# Patient Record
Sex: Female | Born: 1950 | Race: White | Hispanic: Refuse to answer | Smoking: Never smoker
Health system: Southern US, Community
[De-identification: ages and names within clinical notes are randomized; demographics above are authoritative.]

## PROBLEM LIST (undated history)

## (undated) DIAGNOSIS — I10 Essential (primary) hypertension: Secondary | ICD-10-CM

## (undated) DIAGNOSIS — E785 Hyperlipidemia, unspecified: Secondary | ICD-10-CM

## (undated) DIAGNOSIS — E7800 Pure hypercholesterolemia, unspecified: Secondary | ICD-10-CM

## (undated) HISTORY — DX: Pure hypercholesterolemia, unspecified: E78.00

## (undated) HISTORY — PX: OTHER SURGICAL HISTORY: SHX169

## (undated) HISTORY — DX: Hyperlipidemia, unspecified: E78.5

## (undated) HISTORY — PX: ANKLE RECONSTRUCTION: SHX1151

## (undated) HISTORY — PX: CARPAL TUNNEL RELEASE: SHX101

## (undated) HISTORY — PX: CHOLECYSTECTOMY: SHX55

---

## 1998-02-28 ENCOUNTER — Ambulatory Visit (HOSPITAL_COMMUNITY): Admission: RE | Admit: 1998-02-28 | Discharge: 1998-02-28 | Payer: Self-pay | Admitting: Cardiology

## 1998-02-28 ENCOUNTER — Encounter: Payer: Self-pay | Admitting: Cardiology

## 1998-03-01 ENCOUNTER — Encounter: Payer: Self-pay | Admitting: Surgery

## 1998-03-02 ENCOUNTER — Inpatient Hospital Stay (HOSPITAL_COMMUNITY): Admission: RE | Admit: 1998-03-02 | Discharge: 1998-03-03 | Payer: Self-pay | Admitting: Surgery

## 1999-03-04 ENCOUNTER — Other Ambulatory Visit: Admission: RE | Admit: 1999-03-04 | Discharge: 1999-03-04 | Payer: Self-pay | Admitting: Obstetrics and Gynecology

## 1999-08-26 ENCOUNTER — Other Ambulatory Visit: Admission: RE | Admit: 1999-08-26 | Discharge: 1999-08-26 | Payer: Self-pay | Admitting: Obstetrics and Gynecology

## 2000-03-27 ENCOUNTER — Other Ambulatory Visit: Admission: RE | Admit: 2000-03-27 | Discharge: 2000-03-27 | Payer: Self-pay | Admitting: Obstetrics and Gynecology

## 2003-12-20 ENCOUNTER — Other Ambulatory Visit: Admission: RE | Admit: 2003-12-20 | Discharge: 2003-12-20 | Payer: Self-pay | Admitting: Obstetrics and Gynecology

## 2007-02-05 ENCOUNTER — Encounter: Payer: Self-pay | Admitting: Cardiovascular Disease

## 2007-10-04 ENCOUNTER — Encounter: Admission: RE | Admit: 2007-10-04 | Discharge: 2007-10-04 | Payer: Self-pay | Admitting: Internal Medicine

## 2007-10-13 ENCOUNTER — Encounter: Admission: RE | Admit: 2007-10-13 | Discharge: 2007-10-13 | Payer: Self-pay | Admitting: Internal Medicine

## 2008-04-03 ENCOUNTER — Encounter: Admission: RE | Admit: 2008-04-03 | Discharge: 2008-04-03 | Payer: Self-pay | Admitting: Internal Medicine

## 2009-01-05 ENCOUNTER — Encounter: Admission: RE | Admit: 2009-01-05 | Discharge: 2009-01-05 | Payer: Self-pay | Admitting: Internal Medicine

## 2009-01-10 ENCOUNTER — Encounter: Admission: RE | Admit: 2009-01-10 | Discharge: 2009-01-10 | Payer: Self-pay | Admitting: Internal Medicine

## 2010-03-27 ENCOUNTER — Encounter: Admission: RE | Admit: 2010-03-27 | Discharge: 2010-03-27 | Payer: Self-pay | Admitting: Internal Medicine

## 2011-06-10 ENCOUNTER — Other Ambulatory Visit: Payer: Self-pay | Admitting: Internal Medicine

## 2011-06-10 DIAGNOSIS — Z1231 Encounter for screening mammogram for malignant neoplasm of breast: Secondary | ICD-10-CM

## 2011-06-27 ENCOUNTER — Ambulatory Visit
Admission: RE | Admit: 2011-06-27 | Discharge: 2011-06-27 | Disposition: A | Discharge status: Home / Self Care | Payer: BC Managed Care – PPO | Source: Ambulatory Visit | Attending: Internal Medicine | Admitting: Internal Medicine

## 2011-06-27 DIAGNOSIS — Z1231 Encounter for screening mammogram for malignant neoplasm of breast: Secondary | ICD-10-CM

## 2012-01-27 ENCOUNTER — Other Ambulatory Visit: Payer: Self-pay | Admitting: Internal Medicine

## 2012-01-27 DIAGNOSIS — R1032 Left lower quadrant pain: Secondary | ICD-10-CM

## 2012-04-29 ENCOUNTER — Ambulatory Visit
Admission: RE | Admit: 2012-04-29 | Discharge: 2012-04-29 | Disposition: A | Discharge status: Home / Self Care | Payer: BC Managed Care – PPO | Source: Ambulatory Visit | Attending: Internal Medicine | Admitting: Internal Medicine

## 2012-04-29 DIAGNOSIS — R1032 Left lower quadrant pain: Secondary | ICD-10-CM

## 2012-08-24 ENCOUNTER — Other Ambulatory Visit: Payer: Self-pay

## 2012-08-24 DIAGNOSIS — Z1231 Encounter for screening mammogram for malignant neoplasm of breast: Secondary | ICD-10-CM

## 2012-09-29 ENCOUNTER — Ambulatory Visit: Payer: BC Managed Care – PPO

## 2012-10-08 ENCOUNTER — Ambulatory Visit
Admission: RE | Admit: 2012-10-08 | Discharge: 2012-10-08 | Disposition: A | Discharge status: Home / Self Care | Payer: BC Managed Care – PPO | Source: Ambulatory Visit

## 2012-10-08 DIAGNOSIS — Z1231 Encounter for screening mammogram for malignant neoplasm of breast: Secondary | ICD-10-CM

## 2013-10-21 ENCOUNTER — Other Ambulatory Visit: Payer: Self-pay

## 2013-10-21 DIAGNOSIS — Z1231 Encounter for screening mammogram for malignant neoplasm of breast: Secondary | ICD-10-CM

## 2013-11-04 ENCOUNTER — Ambulatory Visit
Admission: RE | Admit: 2013-11-04 | Discharge: 2013-11-04 | Disposition: A | Discharge status: Home / Self Care | Payer: BC Managed Care – PPO | Source: Ambulatory Visit

## 2013-11-04 DIAGNOSIS — Z1231 Encounter for screening mammogram for malignant neoplasm of breast: Secondary | ICD-10-CM

## 2014-10-10 ENCOUNTER — Other Ambulatory Visit: Payer: Self-pay

## 2014-10-10 DIAGNOSIS — Z1231 Encounter for screening mammogram for malignant neoplasm of breast: Secondary | ICD-10-CM

## 2014-11-06 ENCOUNTER — Ambulatory Visit
Admission: RE | Admit: 2014-11-06 | Discharge: 2014-11-06 | Disposition: A | Discharge status: Home / Self Care | Payer: BC Managed Care – PPO | Source: Ambulatory Visit

## 2014-11-06 DIAGNOSIS — Z1231 Encounter for screening mammogram for malignant neoplasm of breast: Secondary | ICD-10-CM

## 2015-12-17 ENCOUNTER — Other Ambulatory Visit: Payer: Self-pay | Admitting: Internal Medicine

## 2015-12-17 DIAGNOSIS — Z1231 Encounter for screening mammogram for malignant neoplasm of breast: Secondary | ICD-10-CM

## 2015-12-19 ENCOUNTER — Ambulatory Visit
Admission: RE | Admit: 2015-12-19 | Discharge: 2015-12-19 | Disposition: A | Discharge status: Home / Self Care | Payer: BC Managed Care – PPO | Source: Ambulatory Visit | Attending: Internal Medicine | Admitting: Internal Medicine

## 2015-12-19 DIAGNOSIS — Z1231 Encounter for screening mammogram for malignant neoplasm of breast: Secondary | ICD-10-CM

## 2016-12-11 ENCOUNTER — Other Ambulatory Visit: Payer: Self-pay | Admitting: Internal Medicine

## 2016-12-11 DIAGNOSIS — Z1231 Encounter for screening mammogram for malignant neoplasm of breast: Secondary | ICD-10-CM

## 2016-12-30 ENCOUNTER — Ambulatory Visit
Admission: RE | Admit: 2016-12-30 | Discharge: 2016-12-30 | Disposition: A | Discharge status: Home / Self Care | Payer: BC Managed Care – PPO | Source: Ambulatory Visit | Attending: Internal Medicine | Admitting: Internal Medicine

## 2016-12-30 DIAGNOSIS — Z1231 Encounter for screening mammogram for malignant neoplasm of breast: Secondary | ICD-10-CM

## 2017-12-31 ENCOUNTER — Other Ambulatory Visit: Payer: Self-pay | Admitting: Internal Medicine

## 2017-12-31 DIAGNOSIS — M5416 Radiculopathy, lumbar region: Secondary | ICD-10-CM

## 2018-01-01 ENCOUNTER — Ambulatory Visit
Admission: RE | Admit: 2018-01-01 | Discharge: 2018-01-01 | Disposition: A | Discharge status: Home / Self Care | Payer: Medicare Other | Source: Ambulatory Visit | Attending: Internal Medicine | Admitting: Internal Medicine

## 2018-01-01 DIAGNOSIS — M5416 Radiculopathy, lumbar region: Secondary | ICD-10-CM

## 2018-02-05 ENCOUNTER — Other Ambulatory Visit: Payer: Self-pay | Admitting: Internal Medicine

## 2018-02-05 DIAGNOSIS — Z1231 Encounter for screening mammogram for malignant neoplasm of breast: Secondary | ICD-10-CM

## 2018-03-09 ENCOUNTER — Ambulatory Visit
Admission: RE | Admit: 2018-03-09 | Discharge: 2018-03-09 | Disposition: A | Discharge status: Home / Self Care | Payer: Medicare Other | Source: Ambulatory Visit | Attending: Internal Medicine | Admitting: Internal Medicine

## 2018-03-09 DIAGNOSIS — Z1231 Encounter for screening mammogram for malignant neoplasm of breast: Secondary | ICD-10-CM

## 2019-02-07 ENCOUNTER — Other Ambulatory Visit: Payer: Self-pay | Admitting: Internal Medicine

## 2019-02-07 DIAGNOSIS — Z1231 Encounter for screening mammogram for malignant neoplasm of breast: Secondary | ICD-10-CM

## 2019-03-23 ENCOUNTER — Ambulatory Visit
Admission: RE | Admit: 2019-03-23 | Discharge: 2019-03-23 | Disposition: A | Discharge status: Home / Self Care | Payer: Medicare Other | Source: Ambulatory Visit | Attending: Internal Medicine | Admitting: Internal Medicine

## 2019-03-23 ENCOUNTER — Other Ambulatory Visit: Payer: Self-pay

## 2019-03-23 DIAGNOSIS — Z1231 Encounter for screening mammogram for malignant neoplasm of breast: Secondary | ICD-10-CM

## 2019-09-05 ENCOUNTER — Encounter (HOSPITAL_COMMUNITY): Payer: Self-pay | Admitting: *Deleted

## 2019-09-05 ENCOUNTER — Emergency Department (HOSPITAL_COMMUNITY)
Admission: EM | Admit: 2019-09-05 | Discharge: 2019-09-06 | Disposition: A | Discharge status: Home / Self Care | Payer: Medicare Other | Attending: Emergency Medicine | Admitting: Emergency Medicine

## 2019-09-05 ENCOUNTER — Other Ambulatory Visit: Payer: Self-pay

## 2019-09-05 DIAGNOSIS — R110 Nausea: Secondary | ICD-10-CM | POA: Insufficient documentation

## 2019-09-05 DIAGNOSIS — Z7984 Long term (current) use of oral hypoglycemic drugs: Secondary | ICD-10-CM | POA: Insufficient documentation

## 2019-09-05 DIAGNOSIS — Z7982 Long term (current) use of aspirin: Secondary | ICD-10-CM | POA: Insufficient documentation

## 2019-09-05 DIAGNOSIS — I447 Left bundle-branch block, unspecified: Secondary | ICD-10-CM | POA: Diagnosis not present

## 2019-09-05 DIAGNOSIS — R42 Dizziness and giddiness: Secondary | ICD-10-CM

## 2019-09-05 DIAGNOSIS — E119 Type 2 diabetes mellitus without complications: Secondary | ICD-10-CM | POA: Insufficient documentation

## 2019-09-05 DIAGNOSIS — Z79899 Other long term (current) drug therapy: Secondary | ICD-10-CM | POA: Diagnosis not present

## 2019-09-05 LAB — CBC WITH DIFFERENTIAL/PLATELET
Abs Immature Granulocytes: 0.03 10*3/uL (ref 0.00–0.07)
Basophils Absolute: 0.1 10*3/uL (ref 0.0–0.1)
Basophils Relative: 1 %
Eosinophils Absolute: 0.3 10*3/uL (ref 0.0–0.5)
Eosinophils Relative: 3 %
HCT: 43.3 % (ref 36.0–46.0)
Hemoglobin: 14.1 g/dL (ref 12.0–15.0)
Immature Granulocytes: 0 %
Lymphocytes Relative: 34 %
Lymphs Abs: 3.5 10*3/uL (ref 0.7–4.0)
MCH: 29 pg (ref 26.0–34.0)
MCHC: 32.6 g/dL (ref 30.0–36.0)
MCV: 89.1 fL (ref 80.0–100.0)
Monocytes Absolute: 0.6 10*3/uL (ref 0.1–1.0)
Monocytes Relative: 6 %
Neutro Abs: 5.8 10*3/uL (ref 1.7–7.7)
Neutrophils Relative %: 56 %
Platelets: 307 10*3/uL (ref 150–400)
RBC: 4.86 MIL/uL (ref 3.87–5.11)
RDW: 13.3 % (ref 11.5–15.5)
WBC: 10.2 10*3/uL (ref 4.0–10.5)
nRBC: 0 % (ref 0.0–0.2)

## 2019-09-05 LAB — BASIC METABOLIC PANEL
Anion gap: 7 (ref 5–15)
BUN: 12 mg/dL (ref 8–23)
CO2: 27 mmol/L (ref 22–32)
Calcium: 9.6 mg/dL (ref 8.9–10.3)
Chloride: 107 mmol/L (ref 98–111)
Creatinine, Ser: 0.78 mg/dL (ref 0.44–1.00)
GFR calc Af Amer: >60 mL/min (ref >60)
GFR calc non Af Amer: >60 mL/min (ref >60)
Glucose, Bld: 153 mg/dL — ABNORMAL HIGH (ref 70–99)
Potassium: 3.8 mmol/L (ref 3.5–5.1)
Sodium: 141 mmol/L (ref 135–145)

## 2019-09-05 NOTE — ED Triage Notes (Signed)
Last 3 weeks went to PCP for difficulty swallowing , last week went to see ent and had procedure done was put on medication for GERD however it gave her diarrhea ,so she has to stop the medicine, this am woke felt unsteady on her feet . States she flet nauseated.

## 2019-09-05 NOTE — ED Notes (Signed)
Have pt call soncornesha radziewicz at [phone]

## 2019-09-06 ENCOUNTER — Emergency Department (HOSPITAL_COMMUNITY): Payer: Medicare Other

## 2019-09-06 ENCOUNTER — Encounter (HOSPITAL_COMMUNITY): Payer: Self-pay | Admitting: Emergency Medicine

## 2019-09-06 LAB — URINALYSIS, ROUTINE W REFLEX MICROSCOPIC
Bilirubin Urine: NEGATIVE
Glucose, UA: NEGATIVE mg/dL
Hgb urine dipstick: NEGATIVE
Ketones, ur: 5 mg/dL — AB
Nitrite: NEGATIVE
Protein, ur: 30 mg/dL — AB
Specific Gravity, Urine: 1.027 (ref 1.005–1.030)
WBC, UA: >50 WBC/hpf — ABNORMAL HIGH (ref 0–5)
pH: 5 (ref 5.0–8.0)

## 2019-09-06 LAB — TROPONIN I (HIGH SENSITIVITY)
Troponin I (High Sensitivity): 6 ng/L (ref <18)
Troponin I (High Sensitivity): 8 ng/L (ref <18)

## 2019-09-06 MED ORDER — MECLIZINE HCL 50 MG PO TABS: 50.0000 mg | ORAL_TABLET | Freq: Three times a day (TID) | ORAL | 0 refills | Status: DC | PRN

## 2019-09-06 MED ORDER — FOSFOMYCIN TROMETHAMINE 3 G PO PACK
3.0000 g | PACK | ORAL | Status: AC
  Administered 2019-09-06: 3 g via ORAL
  Filled 2019-09-06: qty 3

## 2019-09-06 MED ORDER — ONDANSETRON 8 MG PO TBDP: ORAL_TABLET | ORAL | 0 refills | Status: DC

## 2019-09-06 MED ORDER — GADOBUTROL 1 MMOL/ML IV SOLN
10.0000 mL | Freq: Once | INTRAVENOUS | Status: AC | PRN
  Administered 2019-09-06: 10 mL via INTRAVENOUS

## 2019-09-06 MED ORDER — ONDANSETRON 4 MG PO TBDP
8.0000 mg | ORAL_TABLET | Freq: Once | ORAL | Status: AC
  Administered 2019-09-06: 05:00:00 8 mg via ORAL
  Filled 2019-09-06: qty 2

## 2019-09-06 MED ORDER — SODIUM CHLORIDE 0.9 % IV BOLUS
500.0000 mL | INTRAVENOUS | Status: AC
  Administered 2019-09-06: 500 mL via INTRAVENOUS

## 2019-09-06 NOTE — ED Provider Notes (Signed)
69 yo F with a chief complaints of dizziness.  Not described as a vertiginous-like syndrome.  Seen by Dr. Nicanor Alcon and I received the patient in signout.  Plan for MRI and delta troponin and UA.  MRI is returned and is negative for acute stroke.  Delta Theodis Aguas is negative.  UA with large leukocyte esterase and greater than 50 white blood cells.  Rare bacteria.  Patient has no urinary symptoms on reassessment.  Will give 1 dose of fosfomycin to cover for possible infection.  We will have her follow-up with her family doctor and cardiologist that she also had a new bundle branch block of unknown timing.  The case was discussed with cardiology who recommended delta troponin and outpatient follow-up.     Melene Plan, DO 09/06/19 1101

## 2019-09-06 NOTE — Discharge Instructions (Addendum)
Please follow-up with your family doctor and the cardiologist in the office.  Your urine showed white blood cells but did not show bacteria, this is less likely to be urinary tract infection but we will give you a one-time dose of an antibiotic which should cover it if you have one.  Please return to the emergency department for chest pain worsening dizziness inability to walk one-sided numbness or weakness.

## 2019-09-06 NOTE — ED Provider Notes (Signed)
[facility] EMERGENCY DEPARTMENT Provider Note   CSN: [id number] Arrival date & time: 09/05/19  1435     History No chief complaint on file.   Kristy Medina is a 69 y.o. female.  The history is provided by the patient.  Dizziness Quality:  Lightheadedness Severity:  Moderate Onset quality:  Gradual Duration:  1 day Timing:  Constant Progression:  Improving Chronicity:  New Context: not when bending over, not with inactivity, not with loss of consciousness and not when standing up   Relieved by:  Nothing Worsened by:  Nothing Ineffective treatments:  None tried Associated symptoms: nausea   Associated symptoms: no chest pain, no shortness of breath and no weakness   Risk factors: no anemia and no hx of vertigo   Patient with gerd reports nausea and lightheadedhess. No weakness no numbness.  No changes in vision or speech.  Has been having off and on RUE pain in the area of the deltoid that she attributed to work. No CP.  No SOB.  No exertional symptoms.  No vomiting no diaphoresis.        Past Medical History:  Diagnosis Date  . Chest pain   . Diabetes mellitus   . Hypercholesterolemia   . Hyperlipidemia     There are no problems to display for this patient.   Past Surgical History:  Procedure Laterality Date  . ANKLE RECONSTRUCTION    . CARPAL TUNNEL RELEASE    . CHOLECYSTECTOMY    . mastoid surgery       OB History   No obstetric history on file.     Family History  Problem Relation Age of Onset  . COPD Sister   . Lung cancer Brother   . Kidney cancer Brother   . Heart attack Brother   . Breast cancer Maternal Grandmother     Social History   Tobacco Use  . Smoking status: Never Smoker  . Smokeless tobacco: Never Used  Substance Use Topics  . Alcohol use: No  . Drug use: Not on file    Home Medications Prior to Admission medications   Medication Sig Start Date End Date Taking? Authorizing Provider  aspirin 81  MG tablet Take 81 mg by mouth every other day.    [provider]  b complex vitamins tablet Take 1 tablet by mouth daily.    [provider]  benazepril (LOTENSIN) 5 MG tablet Take 5 mg by mouth daily.    [provider]  Cholecalciferol (VITAMIN D) 2000 UNITS tablet Take 2,000 Units by mouth daily.    [provider]  fish oil-omega-3 fatty acids 1000 MG capsule Take 2 g by mouth daily. Take 6 tabs daily    [provider]  Garlic 1000 MG CAPS Take by mouth daily.    [provider]  metFORMIN (GLUCOPHAGE) 500 MG tablet Take 500 mg by mouth 2 (two) times daily with a meal.    [provider]  saxagliptin HCl (ONGLYZA) 5 MG TABS tablet Take by mouth daily.    [provider]  simvastatin (ZOCOR) 40 MG tablet Take 40 mg by mouth every evening.    [provider]    Allergies    Erythromycin  Review of Systems   Review of Systems  Constitutional: Negative for fever.  HENT: Negative for congestion.   Eyes: Negative for visual disturbance.  Respiratory: Negative for cough and shortness of breath.   Cardiovascular: Negative for chest pain.  Gastrointestinal: Positive for nausea. Negative for abdominal pain.  Genitourinary: Negative for difficulty urinating.  Musculoskeletal: Positive for arthralgias. Negative for gait problem.  Skin: Negative for rash.  Neurological: Positive for dizziness and light-headedness. Negative for weakness and numbness.  Psychiatric/Behavioral: Negative for agitation.    Physical Exam Updated Vital Signs BP 140/60 (BP Location: Left Arm)   Pulse 83   Temp 98 F (36.7 C) (Oral)   Resp 17   Ht 5\' 5"  (1.651 m)   Wt 98 kg   SpO2 95%   BMI 35.94 kg/m   Physical Exam Vitals and nursing note reviewed.  Constitutional:      General: She is not in acute distress.    Appearance: Normal appearance.  HENT:     Head: Normocephalic and atraumatic.     Nose: Nose normal.      Mouth/Throat:     Mouth: Mucous membranes are moist.  Eyes:     Extraocular Movements: Extraocular movements intact.     Conjunctiva/sclera: Conjunctivae normal.     Pupils: Pupils are equal, round, and reactive to light.  Cardiovascular:     Rate and Rhythm: Normal rate and regular rhythm.     Pulses: Normal pulses.     Heart sounds: Normal heart sounds.  Pulmonary:     Effort: Pulmonary effort is normal.     Breath sounds: Normal breath sounds.  Abdominal:     General: Abdomen is flat. Bowel sounds are normal.     Tenderness: There is no abdominal tenderness. There is no guarding or rebound.  Musculoskeletal:        General: Normal range of motion.     Cervical back: Normal range of motion and neck supple.  Skin:    General: Skin is warm and dry.     Capillary Refill: Capillary refill takes less than 2 seconds.  Neurological:     General: No focal deficit present.     Mental Status: She is alert and oriented to person, place, and time.     Cranial Nerves: No cranial nerve deficit.     Deep Tendon Reflexes: Reflexes normal.  Psychiatric:        Mood and Affect: Mood normal.        Behavior: Behavior normal.     ED Results / Procedures / Treatments   Labs (all labs ordered are listed, but only abnormal results are displayed) Results for orders placed or performed during the hospital encounter of 09/05/19  CBC with Differential  Result Value Ref Range   WBC 10.2 4.0 - 10.5 K/uL   RBC 4.86 3.87 - 5.11 MIL/uL   Hemoglobin 14.1 12.0 - 15.0 g/dL   HCT 43.3 36.0 - 46.0 %   MCV 89.1 80.0 - 100.0 fL   MCH 29.0 26.0 - 34.0 pg   MCHC 32.6 30.0 - 36.0 g/dL   RDW 13.3 11.5 - 15.5 %   Platelets 307 150 - 400 K/uL   nRBC 0.0 0.0 - 0.2 %   Neutrophils Relative % 56 %   Neutro Abs 5.8 1.7 - 7.7 K/uL   Lymphocytes Relative 34 %   Lymphs Abs 3.5 0.7 - 4.0 K/uL   Monocytes Relative 6 %   Monocytes Absolute 0.6 0.1 - 1.0 K/uL   Eosinophils Relative 3 %   Eosinophils Absolute 0.3  0.0 - 0.5 K/uL   Basophils Relative 1 %   Basophils Absolute 0.1 0.0 - 0.1 K/uL   Immature Granulocytes 0 %   Abs  Immature Granulocytes 0.03 0.00 - 0.07 K/uL  Basic metabolic panel  Result Value Ref Range   Sodium 141 135 - 145 mmol/L   Potassium 3.8 3.5 - 5.1 mmol/L   Chloride 107 98 - 111 mmol/L   CO2 27 22 - 32 mmol/L   Glucose, Bld 153 (H) 70 - 99 mg/dL   BUN 12 8 - 23 mg/dL   Creatinine, Ser 1.15 0.44 - 1.00 mg/dL   Calcium 9.6 8.9 - 72.6 mg/dL   GFR calc non Af Amer >60 >60 mL/min   GFR calc Af Amer >60 >60 mL/min   Anion gap 7 5 - 15   CT Head Wo Contrast  Result Date: 09/06/2019 CLINICAL DATA:  Lightheadedness EXAM: CT HEAD WITHOUT CONTRAST TECHNIQUE: Contiguous axial images were obtained from the base of the skull through the vertex without intravenous contrast. COMPARISON:  None. FINDINGS: Brain: No evidence of acute infarction, hemorrhage, hydrocephalus, extra-axial collection or mass lesion/mass effect. Vascular: No hyperdense vessel or unexpected calcification. Skull: Hyperostosis interna, incidental. Negative for fracture or focal lesion. Sinuses/Orbits: Negative. IMPRESSION: Negative head CT. Electronically Signed   By: Marnee Spring M.D.   On: 09/06/2019 05:30    EKG EKG Interpretation  Date/Time:  Monday Telena Peyser 12 2021 15:17:06 EDT Ventricular Rate:  80 PR Interval:  186 QRS Duration: 144 QT Interval:  422 QTC Calculation: 486 R Axis:   -57 Text Interpretation: Normal sinus rhythm Left axis deviation Left bundle branch block Confirmed by Manasvi Dickard ([id number]) on 09/06/2019 5:01:51 AM   Radiology No results found.  Procedures Procedures (including critical care time)  Medications Ordered in ED Medications  sodium chloride 0.9 % bolus 500 mL (has no administration in time range)  ondansetron (ZOFRAN-ODT) disintegrating tablet 8 mg (8 mg Oral Given 09/06/19 0509)    ED Course  I have reviewed the triage vital signs and the nursing notes.  Pertinent  labs & imaging results that were available during my care of the patient were reviewed by me and considered in my medical decision making (see chart for details).   559 Case d/w Dr. Otelia Limes MRI of the brain with and without, look at vestibular cochlear exam.     643 case d/w Dr. Loyal Gambler of cardiology.  Delta troponin, if negative may follow up as an outpatient for echocardiogram.    Will need an MRI to assess the light headedness.  Patient has a new left bundle branch block.  We do not have an old EKG and will need to follow up with cardiology.    Patient has been informed of left bundle branch block and need to follow up with cardiology as an outpatient.  Patient verbalizes understanding and agrees to follow up.    Final Clinical Impression(s) / ED Diagnoses   Signed out to Dr. Adela Lank pending MRI and delta troponin.     Estevan Kersh, MD 09/06/19 9741

## 2019-09-06 NOTE — ED Notes (Signed)
Pt verbalized understanding of discharge instructions. Follow up care and prescriptions reviewed w/ pt and daughter-in-law. Pt brought to lobby via wheelchair.

## 2019-09-07 LAB — URINE CULTURE

## 2019-09-12 ENCOUNTER — Other Ambulatory Visit: Payer: Self-pay

## 2019-09-12 ENCOUNTER — Encounter: Payer: Self-pay | Admitting: Interventional Cardiology

## 2019-09-12 ENCOUNTER — Ambulatory Visit: Payer: Medicare Other | Admitting: Interventional Cardiology

## 2019-09-12 VITALS — BP 122/78 | HR 88 | Ht 65.0 in | Wt 215.0 lb

## 2019-09-12 DIAGNOSIS — R0600 Dyspnea, unspecified: Secondary | ICD-10-CM | POA: Diagnosis not present

## 2019-09-12 DIAGNOSIS — E1165 Type 2 diabetes mellitus with hyperglycemia: Secondary | ICD-10-CM | POA: Diagnosis not present

## 2019-09-12 DIAGNOSIS — R0602 Shortness of breath: Secondary | ICD-10-CM | POA: Diagnosis not present

## 2019-09-12 DIAGNOSIS — R9431 Abnormal electrocardiogram [ECG] [EKG]: Secondary | ICD-10-CM

## 2019-09-12 DIAGNOSIS — Z794 Long term (current) use of insulin: Secondary | ICD-10-CM

## 2019-09-12 DIAGNOSIS — R0609 Other forms of dyspnea: Secondary | ICD-10-CM

## 2019-09-12 MED ORDER — METOPROLOL TARTRATE 100 MG PO TABS: ORAL_TABLET | ORAL | 0 refills | Status: DC

## 2019-09-12 NOTE — Progress Notes (Signed)
Cardiology Office Note   Date:  09/12/2019   ID:  Kristy Medina, DOB 1950-09-23, MRN [id number]  PCP:  Crist Infante, MD    No chief complaint on file.  LBBB  Wt Readings from Last 3 Encounters:  09/12/19 215 lb (97.5 kg)  09/05/19 216 lb (98 kg)       History of Present Illness: Kristy Medina is a 69 y.o. female who is being seen today for the evaluation of  at the request of Crist Infante, MD.  In 2008, she had an abnormal nuc, partially due to breast attenuation.  She had some dysphagia in 2021 and had an eval from ENT.  GERD meds have been adjusted.  Now on Pepcid.  Had severe dizziness on 4/12 and went to ER.  SHe was noted to have an LBBB.  Troponins were negative.  She describes it as a lightheadedness. Has felt well since that time.   Denies : Chest pain. Dizziness. Leg edema. Nitroglycerin use. Orthopnea. Palpitations. Paroxysmal nocturnal dyspnea. Shortness of breath. Syncope.   Father with MI in his 37s.   She has been diabetic for 23 years.  A1C 7.2.   Kristy Medina has been most strenuous exercise.  She has had some DOE; decreased stamina.  Her walking has decreased recently as well.        Past Medical History:  Diagnosis Date  . Chest pain   . Diabetes mellitus   . Hypercholesterolemia   . Hyperlipidemia     Past Surgical History:  Procedure Laterality Date  . ANKLE RECONSTRUCTION    . CARPAL TUNNEL RELEASE    . CHOLECYSTECTOMY    . mastoid surgery       Current Outpatient Medications  Medication Sig Dispense Refill  . acetaminophen (TYLENOL) 325 MG tablet Take 650 mg by mouth every 6 (six) hours as needed for mild pain or headache.    Marland Kitchen aspirin 81 MG tablet Take 81 mg by mouth every other day.    . b complex vitamins tablet Take 1 tablet by mouth daily.    Marland Kitchen BIOTIN PO Take 1 tablet by mouth daily.    . Cholecalciferol (VITAMIN D3) 50 MCG (2000 UT) TABS Take 2,000 Units by mouth daily.    . famotidine (PEPCID AC MAXIMUM  STRENGTH) 20 MG tablet Take 20 mg by mouth daily as needed for heartburn or indigestion.    . fish oil-omega-3 fatty acids 1000 MG capsule Take 1 g by mouth daily.     . Garlic 8416 MG CAPS Take 1,000 mg by mouth daily.     . insulin NPH-regular Human (NOVOLIN 70/30) (70-30) 100 UNIT/ML injection Inject 18-28 Units into the skin See admin instructions. Inject 28 units into the skin before breakfast and 18 units at bedtime    . Menthol-Methyl Salicylate (THERA-GESIC PLUS) CREA Apply 1 application topically 2 (two) times daily as needed (to painful sites).     . metFORMIN (GLUCOPHAGE) 1000 MG tablet Take 1,000 mg by mouth 2 (two) times daily.    Marland Kitchen PRALUENT 150 MG/ML SOAJ every 28 (twenty-eight) days.     Marland Kitchen trolamine salicylate (ASPERCREME) 10 % cream Apply 1 application topically 2 (two) times daily as needed (to painful sites).     . TURMERIC PO Take 1 capsule by mouth daily.      No current facility-administered medications for this visit.    Allergies:   Omeprazole, Statins, Erythromycin, and Tramadol    Social History:  The patient  reports that she has never smoked. She has never used smokeless tobacco. She reports that she does not drink alcohol.   Family History:  The patient's family history includes Breast cancer in her maternal grandmother; COPD in her sister; Heart attack in her brother; Kidney cancer in her brother; Lung cancer in her brother.    ROS:  Please see the history of present illness.   Otherwise, review of systems are positive for decreased staina.   All other systems are reviewed and negative.    PHYSICAL EXAM: VS:  BP 122/78   Pulse 88   Ht 5\' 5"  (1.651 m)   Wt 215 lb (97.5 kg)   SpO2 96%   BMI 35.78 kg/m  , BMI Body mass index is 35.78 kg/m. GEN: Well nourished, well developed, in no acute distress  HEENT: normal  Neck: no JVD, carotid bruits, or masses Cardiac: RRR; no murmurs, rubs, or gallops,no edema  Respiratory:  clear to auscultation bilaterally,  normal work of breathing GI: soft, nontender, nondistended, + BS MS: no deformity or atrophy  Skin: warm and dry, no rash Neuro:  Strength and sensation are intact Psych: euthymic mood, full affect   EKG:   The ekg ordered 09/05/19 demonstrates NSR, LBBB   Recent Labs: 09/05/2019: BUN 12; Creatinine, Ser 0.78; Hemoglobin 14.1; Platelets 307; Potassium 3.8; Sodium 141   Lipid Panel No results found for: CHOL, TRIG, HDL, CHOLHDL, VLDL, LDLCALC, LDLDIRECT   Other studies Reviewed: Additional studies/ records that were reviewed today with results demonstrating: ER records.   ASSESSMENT AND PLAN:  1. Abnormal ECG: LBBB- unknown duration. No old ECG for comparison.  Narrow QRS in 2008 based on ETT. Prior breast attenuation in 2008.  Plan for CTA coronaries to eval or ischemia given DM and DOE/ decreased stamina.  Metoprolol 100 mg the morning of the CT scan.  2. Hyperlipidemia: On Praluent.  LDL 36 in 09/2018.  Intolerant of statins due to memory problems.  3. DM: A1C 7.2.  Whole food plant based diet recommended.  4. Decreased balance- balance classes cancelled.   5. Not interested in COVID cvaccines.  Stays out of crowds.     Current medicines are reviewed at length with the patient today.  The patient concerns regarding her medicines were addressed.  The following changes have been made:  No change  Labs/ tests ordered today include: Coronary CTA No orders of the defined types were placed in this encounter.   Recommend 150 minutes/week of aerobic exercise Low fat, low carb, high fiber diet recommended  Disposition:   FU for CT scan   Signed, 10/2018, MD  09/12/2019 4:10 PM    [facility] [address] [place], [place], [place]  [place] Phone: [phone]; Fax: [phone]

## 2019-09-12 NOTE — Patient Instructions (Signed)
Medication Instructions:  Your physician recommends that you continue on your current medications as directed. Please refer to the Current Medication list given to you today.  *If you need a refill on your cardiac medications before your next appointment, please call your pharmacy*   Lab Work: None today  If you have labs (blood work) drawn today and your tests are completely normal, you will receive your results only by: Marland Kitchen MyChart Message (if you have MyChart) OR . A paper copy in the mail If you have any lab test that is abnormal or we need to change your treatment, we will call you to review the results.   Testing/Procedures: Your physician has requested that you have cardiac CT. Cardiac computed tomography (CT) is a painless test that uses an x-ray machine to take clear, detailed pictures of your heart. For further information please visit [email or web address] Please follow instruction sheet as given.  Follow-Up: Based on test results   Other Instructions Your cardiac CT will be scheduled at one of the below locations:   [facility] [address] [place], [place] [address] [phone]  OR  [facility] [address] [place], [place] [address] [phone]  If scheduled at [facility], please arrive at the [facility] main entrance of [facility] 30 minutes prior to test start time. Proceed to the [facility] Radiology Department (first floor) to check-in and test prep.  If scheduled at [facility], please arrive 15 mins early for check-in and test prep.  Please follow these instructions carefully (unless otherwise directed):   On the Night Before the Test: . Be sure to Drink plenty of water. . Do not consume any caffeinated/decaffeinated beverages or chocolate 12 hours prior to your test. . Do not take any antihistamines 12 hours prior to your test. . If you take  Metformin do not take 24 hours prior to test.   On the Day of the Test: . Drink plenty of water. Do not drink any water within one hour of the test. . Do not eat any food 4 hours prior to the test. . You may take your regular medications prior to the test.  . Take metoprolol (Lopressor) 100 MG two hours prior to test. . HOLD Metformin the day of the test. . HOLD insulin the morning of the test . FEMALES- please wear underwire-free bra if available      After the Test: . Drink plenty of water. . After receiving IV contrast, you may experience a mild flushed feeling. This is normal. . On occasion, you may experience a mild rash up to 24 hours after the test. This is not dangerous. If this occurs, you can take Benadryl 25 mg and increase your fluid intake. . If you experience trouble breathing, this can be serious. If it is severe call 911 IMMEDIATELY. If it is mild, please call our office. Marland Kitchen HOLD metformin 48 hours after completing test unless otherwise instructed.   Once we have confirmed authorization from your insurance company, we will call you to set up a date and time for your test.   For non-scheduling related questions, please contact the cardiac imaging nurse navigator should you have any questions/concerns: Rockwell Alexandria, RN Navigator Cardiac Imaging [facility] [phone] office  For scheduling needs, including cancellations and rescheduling, please call [phone].

## 2019-09-28 ENCOUNTER — Telehealth (HOSPITAL_COMMUNITY): Payer: Self-pay | Admitting: Emergency Medicine

## 2019-09-28 ENCOUNTER — Encounter (HOSPITAL_COMMUNITY): Payer: Self-pay

## 2019-09-28 NOTE — Telephone Encounter (Signed)
Attempted to call patient regarding upcoming cardiac CT appointment. °Left message on voicemail with name and callback number °Medford Staheli RN Navigator Cardiac Imaging °[facility] °[phone] Office °[phone] Cell ° °

## 2019-09-29 ENCOUNTER — Other Ambulatory Visit: Payer: Self-pay

## 2019-09-29 ENCOUNTER — Ambulatory Visit
Admission: RE | Admit: 2019-09-29 | Discharge: 2019-09-29 | Disposition: A | Discharge status: Home / Self Care | Payer: Medicare Other | Source: Ambulatory Visit | Attending: Interventional Cardiology | Admitting: Interventional Cardiology

## 2019-09-29 DIAGNOSIS — E1165 Type 2 diabetes mellitus with hyperglycemia: Secondary | ICD-10-CM | POA: Diagnosis present

## 2019-09-29 DIAGNOSIS — R0602 Shortness of breath: Secondary | ICD-10-CM | POA: Insufficient documentation

## 2019-09-29 DIAGNOSIS — Z794 Long term (current) use of insulin: Secondary | ICD-10-CM | POA: Diagnosis present

## 2019-09-29 DIAGNOSIS — R9431 Abnormal electrocardiogram [ECG] [EKG]: Secondary | ICD-10-CM | POA: Insufficient documentation

## 2019-09-29 DIAGNOSIS — R0600 Dyspnea, unspecified: Secondary | ICD-10-CM | POA: Insufficient documentation

## 2019-09-29 DIAGNOSIS — R0609 Other forms of dyspnea: Secondary | ICD-10-CM

## 2019-09-29 HISTORY — DX: Essential (primary) hypertension: I10

## 2019-09-29 MED ORDER — METOPROLOL TARTRATE 5 MG/5ML IV SOLN
5.0000 mg | INTRAVENOUS | Status: DC | PRN
  Administered 2019-09-29: 11:00:00 5 mg via INTRAVENOUS

## 2019-09-29 MED ORDER — NITROGLYCERIN 0.4 MG SL SUBL
0.8000 mg | SUBLINGUAL_TABLET | SUBLINGUAL | Status: DC | PRN
  Administered 2019-09-29: 11:00:00 0.8 mg via SUBLINGUAL

## 2019-09-29 MED ORDER — IOHEXOL 350 MG/ML SOLN
125.0000 mL | Freq: Once | INTRAVENOUS | Status: AC | PRN
  Administered 2019-09-29: 11:00:00 115 mL via INTRAVENOUS

## 2020-01-24 ENCOUNTER — Ambulatory Visit: Payer: Medicare Other | Attending: Internal Medicine

## 2020-01-24 DIAGNOSIS — Z23 Encounter for immunization: Secondary | ICD-10-CM

## 2020-01-24 NOTE — Progress Notes (Signed)
   Covid-19 Vaccination Clinic  Name:  Naina Sleeper    MRN: [id number] DOB: 05-18-1951  01/24/2020  Ms. Sease was observed post Covid-19 immunization for 15 minutes without incident. She was provided with Vaccine Information Sheet and instruction to access the V-Safe system.   Ms. Bassinger was instructed to call 911 with any severe reactions post vaccine: Marland Kitchen Difficulty breathing  . Swelling of face and throat  . A fast heartbeat  . A bad rash all over body  . Dizziness and weakness   Immunizations Administered    Name Date Dose VIS Date Route   Pfizer COVID-19 Vaccine 01/24/2020  9:45 AM 0.3 mL 07/20/2018 Intramuscular   Manufacturer: [employer], [employer]   Lot: [id number]   NDC: [address]-2

## 2020-02-14 ENCOUNTER — Ambulatory Visit: Payer: Medicare Other | Attending: Internal Medicine

## 2020-02-14 DIAGNOSIS — Z23 Encounter for immunization: Secondary | ICD-10-CM

## 2020-02-14 NOTE — Progress Notes (Signed)
   Covid-19 Vaccination Clinic  Name:  Kristy Medina    MRN: [id number] DOB: Nov 16, 1950  02/14/2020  [name] was observed post Covid-19 immunization for 15 minutes without incident. She was provided with Vaccine Information Sheet and instruction to access the V-Safe system.   [name] was instructed to call 911 with any severe reactions post vaccine: Marland Kitchen Difficulty breathing  . Swelling of face and throat  . A fast heartbeat  . A bad rash all over body  . Dizziness and weakness   Immunizations Administered    Name Date Dose VIS Date Route   Pfizer COVID-19 Vaccine 02/14/2020  9:19 AM 0.3 mL 07/20/2018 Intramuscular   Manufacturer: [employer], [employer]   Lot: [id number]   NDC: [address]-7

## 2020-03-27 ENCOUNTER — Other Ambulatory Visit: Payer: Self-pay | Admitting: Internal Medicine

## 2020-03-27 DIAGNOSIS — Z1231 Encounter for screening mammogram for malignant neoplasm of breast: Secondary | ICD-10-CM

## 2020-03-28 ENCOUNTER — Other Ambulatory Visit: Payer: Self-pay

## 2020-03-28 ENCOUNTER — Ambulatory Visit
Admission: RE | Admit: 2020-03-28 | Discharge: 2020-03-28 | Disposition: A | Discharge status: Home / Self Care | Payer: Medicare Other | Source: Ambulatory Visit | Attending: Internal Medicine | Admitting: Internal Medicine

## 2020-03-28 DIAGNOSIS — Z1231 Encounter for screening mammogram for malignant neoplasm of breast: Secondary | ICD-10-CM

## 2020-11-03 LAB — EXTERNAL GENERIC LAB PROCEDURE: COLOGUARD: NEGATIVE

## 2021-03-01 ENCOUNTER — Other Ambulatory Visit: Payer: Self-pay | Admitting: Internal Medicine

## 2021-03-01 DIAGNOSIS — Z1231 Encounter for screening mammogram for malignant neoplasm of breast: Secondary | ICD-10-CM

## 2021-04-04 ENCOUNTER — Ambulatory Visit
Admission: RE | Admit: 2021-04-04 | Discharge: 2021-04-04 | Disposition: A | Discharge status: Home / Self Care | Payer: Medicare Other | Source: Ambulatory Visit | Attending: Internal Medicine | Admitting: Internal Medicine

## 2021-04-04 ENCOUNTER — Other Ambulatory Visit: Payer: Self-pay

## 2021-04-04 DIAGNOSIS — Z1231 Encounter for screening mammogram for malignant neoplasm of breast: Secondary | ICD-10-CM

## 2022-03-12 ENCOUNTER — Other Ambulatory Visit: Payer: Self-pay | Admitting: Internal Medicine

## 2022-03-12 DIAGNOSIS — Z1231 Encounter for screening mammogram for malignant neoplasm of breast: Secondary | ICD-10-CM

## 2022-04-29 ENCOUNTER — Ambulatory Visit
Admission: RE | Admit: 2022-04-29 | Discharge: 2022-04-29 | Disposition: A | Discharge status: Home / Self Care | Payer: Medicare Other | Source: Ambulatory Visit | Attending: Internal Medicine | Admitting: Internal Medicine

## 2022-04-29 DIAGNOSIS — Z1231 Encounter for screening mammogram for malignant neoplasm of breast: Secondary | ICD-10-CM

## 2023-04-01 ENCOUNTER — Other Ambulatory Visit: Payer: Self-pay | Admitting: Internal Medicine

## 2023-04-01 DIAGNOSIS — Z1231 Encounter for screening mammogram for malignant neoplasm of breast: Secondary | ICD-10-CM

## 2023-04-06 ENCOUNTER — Encounter: Payer: Self-pay | Admitting: Neurology

## 2023-05-05 ENCOUNTER — Ambulatory Visit
Admission: RE | Admit: 2023-05-05 | Discharge: 2023-05-05 | Disposition: A | Discharge status: Home / Self Care | Payer: Medicare Other | Source: Ambulatory Visit | Attending: Internal Medicine | Admitting: Internal Medicine

## 2023-05-05 DIAGNOSIS — Z1231 Encounter for screening mammogram for malignant neoplasm of breast: Secondary | ICD-10-CM

## 2023-05-25 NOTE — Progress Notes (Signed)
 Initial neurology clinic note  Reason for Evaluation: Consultation requested by Shayne Anes, MD for an opinion regarding neuropathy. My final recommendations will be communicated back to the requesting physician by way of shared medical record or letter to requesting physician via [place]  mail.  HPI: This is Ms. Marshall Janet Piety, a 72 y.o. right-handed female with a medical history of HTN, DM, HLD, RA, carpal tunnel syndrome, right sided Bell's Palsy who presents to neurology clinic with the chief complaint of numbness of feet. The patient is alone today.  Patient has numbness and tingling in her feet. She has occasional shooting pain in toes. She thinks the symptoms have been present for about 3 years. She has a history of diabetes (HbA1c in 9.0 in 02/2023). She states the left side is worse than the right. She also endorses low back pain. She denies any shooting pain into her legs. She endorses imbalance but no recent falls.  She was previously on Novalog and was switch to other insulin in 03/2023. She felt since switching, maybe the numbness and tingling has improved.  Patient has a history of CTS s/p release many years ago.  Of note, patient has been diagnosed with RA but maybe also psoriatic arthritis, per patient.  Of note, patient has been on B complex, vit D, tumeric, omega 3, magnesium for years.  The patient does not report symptoms referable to autonomic dysfunction including impaired sweating, heat or cold intolerance, excessive mucosal dryness, gastroparetic early satiety, postprandial abdominal bloating, constipation, bowel or bladder dyscontrol, or syncope/presyncope/orthostatic intolerance.  She does not report any constitutional symptoms like fever, night sweats, anorexia or unintentional weight loss. She does endorse getting hot in the mornings when she wakes up and goes to the kitchen.  EtOH use: None, never  Restrictive diet? No Family history of  neuropathy/myopathy/neurologic disease? Sister had back issues   MEDICATIONS:  Outpatient Encounter Medications as of 06/03/2023  Medication Sig   acetaminophen (TYLENOL) 325 MG tablet Take 650 mg by mouth every 6 (six) hours as needed for mild pain or headache.   aspirin 81 MG tablet Take 81 mg by mouth every other day.   b complex vitamins tablet Take 1 tablet by mouth daily.   CHERRY PO Take 1 tablet by mouth daily.   Cholecalciferol (VITAMIN D3) 50 MCG (2000 UT) TABS Take 2,000 Units by mouth daily.   Continuous Glucose Sensor (FREESTYLE LIBRE 3 PLUS SENSOR) MISC    Evolocumab (REPATHA SURECLICK) 140 MG/ML SOAJ Inject 140 mg as directed every 14 (fourteen) days.   famotidine (PEPCID AC MAXIMUM STRENGTH) 20 MG tablet Take 20 mg by mouth daily as needed for heartburn or indigestion.   fish oil-omega-3 fatty acids 1000 MG capsule Take 1 g by mouth daily.    Garlic 1000 MG CAPS Take 1,000 mg by mouth daily.    HUMALOG KWIKPEN 100 UNIT/ML KwikPen Inject 8 Units into the skin in the morning and at bedtime.   Menthol-Methyl Salicylate (THERA-GESIC PLUS) CREA Apply 1 application topically 2 (two) times daily as needed (to painful sites).    metFORMIN (GLUCOPHAGE) 1000 MG tablet Take 1,000 mg by mouth 2 (two) times daily.   trolamine salicylate (ASPERCREME) 10 % cream Apply 1 application topically 2 (two) times daily as needed (to painful sites).    TURMERIC PO Take 1 capsule by mouth daily.    BIOTIN PO Take 1 tablet by mouth daily. (Patient not taking: Reported on 06/03/2023)   insulin NPH-regular Human (NOVOLIN 70/30) (70-30)  100 UNIT/ML injection Inject 18-28 Units into the skin See admin instructions. Inject 28 units into the skin before breakfast and 18 units at bedtime (Patient not taking: Reported on 06/03/2023)   metoprolol  tartrate (LOPRESSOR ) 100 MG tablet Take 1 tablet by mouth 2 hours prior to Cardiac CT (Patient not taking: Reported on 06/03/2023)   PRALUENT 150 MG/ML SOAJ every 28  (twenty-eight) days.  (Patient not taking: Reported on 06/03/2023)   No facility-administered encounter medications on file as of 06/03/2023.    PAST MEDICAL HISTORY: Past Medical History:  Diagnosis Date   Chest pain    Diabetes mellitus    Hypercholesterolemia    Hyperlipidemia    Hypertension     PAST SURGICAL HISTORY: Past Surgical History:  Procedure Laterality Date   ANKLE RECONSTRUCTION     CARPAL TUNNEL RELEASE     CHOLECYSTECTOMY     mastoid surgery      ALLERGIES: Allergies  Allergen Reactions   Omeprazole Diarrhea   Statins Other (See Comments)    Brain fog and severe memory issues   Erythromycin Nausea And Vomiting   Tramadol Nausea And Vomiting    FAMILY HISTORY: Family History  Problem Relation Age of Onset   COPD Sister    Lung cancer Brother    Kidney cancer Brother    Heart attack Brother    Breast cancer Maternal Grandmother     SOCIAL HISTORY: Social History   Tobacco Use   Smoking status: Never   Smokeless tobacco: Never  Vaping Use   Vaping status: Never Used  Substance Use Topics   Alcohol use: No   Drug use: Never   Social History   Social History Narrative   Are you right handed or left handed? Right   Are you currently employed ?    What is your current occupation? retired   Do you live at home alone?    Who lives with you? daughter   What type of home do you live in: 1 story or 2 story? one    Caffiene 2 cups daily or more at times     OBJECTIVE: PHYSICAL EXAM: BP 137/69 (Cuff Size: Large)   Pulse 95   Ht 5' 4 (1.626 m)   Wt 209 lb (94.8 kg)   SpO2 97%   BMI 35.87 kg/m   General: General appearance: Awake and alert. No distress. Cooperative with exam.  Skin: No obvious rash or jaundice. HEENT: Atraumatic. Anicteric. Lungs: Non-labored breathing on room air  Heart: Regular Extremities: No edema. No obvious deformity.  Musculoskeletal: No obvious joint swelling. Psych: Affect  appropriate.  Neurological: Mental Status: Alert. Speech fluent. No pseudobulbar affect Cranial Nerves: CNII: No RAPD. Visual fields grossly intact. CNIII, IV, VI: PERRL. No nystagmus. EOMI. CN V: Facial sensation intact bilaterally to fine touch. CN VII: Mild facial asymmetry on right (hx of Bell's palsy on this side). No ptosis at rest. CN VIII: Hearing grossly intact bilaterally. CN IX: No hypophonia. CN X: Palate elevates symmetrically. CN XI: Full strength shoulder shrug bilaterally. CN XII: Tongue protrusion full and midline. No atrophy or fasciculations. No significant dysarthria Motor: Tone: paratonia. No atrophy.  Individual muscle group testing (MRC grade out of 5):  Movement     Neck flexion 5    Neck extension 5     Right Left   Shoulder abduction 5 5   Elbow flexion 5 5   Elbow extension 5 5   Finger abduction - FDI 5 5  Finger abduction - ADM 5 5   Finger extension 5 5   Finger distal flexion - 2/3 5 5    Finger distal flexion - 4/5 5 5    Thumb flexion - FPL 5 5   Thumb abduction - APB 5- 5-    Hip flexion 5 5- Giveway weakness due to pain on left  Hip extension 5 5   Hip adduction 5 5   Hip abduction 5 5   Knee extension 5 5   Knee flexion 5 5   Dorsiflexion 5 5   Plantarflexion 5 5   Great toe extension 5 5   Great toe flexion 5 5     Reflexes:  Right Left   Bicep 2+ 2+   Tricep 2+ 2+   BrRad 2+ 2+   Knee 2+ 0   Ankle 1+ 0    Pathological Reflexes: Babinski: flexor response bilaterally Hoffman: absent bilaterally Troemner: absent bilaterally Sensation: Pinprick: Diminished in bilateral feet, but L >>> R; intact at knees; intact in upper limbs Vibration: Intact in upper limbs; Absent in bilateral great toes and ankles, intact in knees Proprioception: Mildly diminished in bilateral great toes. Coordination: Intact finger-to- nose-finger bilaterally. Romberg with mild sway. Gait: Able to rise from chair with arms crossed unassisted.  Normal, narrow-based gait.  Lab and Test Review: External labs: 03/11/23: HbA1c: 9.0 TSH 1.600 CBC w/ diff unremarkable CMP significant for glucose of 154 Lipid panel: Tchol 177, LDL 91, TG 216  Imaging: MRI brain w/wo contrast (09/06/19): FINDINGS: Brain:   Mild intermittent motion degradation.   There is no evidence of acute infarct.   No evidence of intracranial mass.   No midline shift or extra-axial fluid collection.   No chronic intracranial blood products.   Mild patchy T2/FLAIR hyperintensity within the cerebral white matter and pons is nonspecific, but consistent with chronic small vessel ischemic disease.   No abnormal intracranial enhancement is identified.   Cerebral volume is normal for age.   Vascular: Flow voids maintained within the proximal large arterial vessels.   Skull and upper cervical spine: No focal marrow lesion.   Sinuses/Orbits: Visualized orbits demonstrate no acute abnormality. Mild ethmoid sinus mucosal thickening. Trace fluid within right mastoid air cells.   IMPRESSION: No evidence of acute intracranial abnormality.   Mild chronic small vessel ischemic changes within the cerebral white matter and pons.   Mild ethmoid sinus mucosal thickening. Trace right mastoid effusion.  MRI lumbar spine wo contrast (01/01/18): FINDINGS: Segmentation: Transitional lumbosacral anatomy with the transitional segment considered a partially lumbarized S1 with rudimentary S1-2 disc.   Alignment: 2 mm anterolisthesis of L5 on S1. Minimal right convex lumbar spine curvature.   Vertebrae: No fracture, suspicious osseous lesion, or significant marrow edema. Small L2 superior endplate Schmorl's node. Mild type 2 degenerative endplate changes on the left at L5-S1.   Conus medullaris and cauda equina: Conus extends to the L1 level. Conus and cauda equina appear normal.   Paraspinal and other soft tissues: 3 cm T2 hypointense mass in the uterine  fundus, possibly an intramural fibroid.   Disc levels:   T12-L1: Negative.   L1-2: Mild disc desiccation.  Mild spondylosis.  No stenosis.   L2-3: Normal disc.  Mild facet hypertrophy without stenosis.   L3-4: Disc desiccation. Minimal disc bulging and mild facet hypertrophy without stenosis.   L4-5: Disc desiccation. Mild disc bulging and mild facet and ligamentum flavum hypertrophy without stenosis.   L5-S1: Anterolisthesis with disc uncovering, mild ligamentum flavum  hypertrophy, and severe facet arthrosis result in mild right lateral recess stenosis without significant spinal or neural foraminal stenosis.   S1-2: Transitional level with rudimentary disc. No stenosis.   IMPRESSION: 1. Severe L5-S1 facet arthrosis with grade 1 anterolisthesis and mild right lateral recess stenosis. 2. Mild spondylosis and facet arthrosis elsewhere without stenosis. 3. 3 cm uterine mass, most likely a fibroid.  ASSESSMENT: Desarai Barrack is a 72 y.o. female who presents for evaluation of numbness and tingling in her feet. She has a relevant medical history of HTN, DM, HLD, RA, carpal tunnel syndrome, right sided Bell's Palsy. Her neurological examination is pertinent for diminished sensation in bilateral lower extremities in a length dependent fashion, but with some asymmetry with left more affected than right and asymmetric reflexes. Available diagnostic data is significant for HbA1c of 9.0 in 02/2023. 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 I discussed EMG or MRI lumbar spine but patient wanted to think about it. I will get labs to look for other treatable causes of neuropathy.  PLAN: -Blood work: B1, B6, B12, IFE -May stop B complex pending results -Discussed EMG vs MRI lumbar spine. Patient to think about it and may get after labs result. -Lidocaine cream PRN for foot  pain -Discussed alpha lipoic acid 600 mg once or twice daily -Discussed neuropathic medications, but patient is not currently interested.  -Return to clinic in 6 months  The impression above as well as the plan as outlined below were extensively discussed with the patient who voiced understanding. All questions were answered to their satisfaction.  The patient was counseled on pertinent fall precautions per the printed material provided today, and as noted under the Patient Instructions section below.  When available, results of the above investigations and possible further recommendations will be communicated to the patient via telephone/MyChart. Patient to call office if not contacted after expected testing turnaround time.   Total time spent reviewing records, interview, history/exam, documentation, and coordination of care on day of encounter:  60 min   Thank you for allowing me to participate in patient's care.  If I can answer any additional questions, I would be pleased to do so.  Venetia Potters, MD   CC: Shayne Anes, MD [address] [place] [address]  CC: Referring provider: Shayne Anes, MD [address] [place],  [place] [address]

## 2023-05-27 HISTORY — PX: MELANOMA EXCISION: SHX5266

## 2023-06-03 ENCOUNTER — Other Ambulatory Visit: Payer: Medicare Other

## 2023-06-03 ENCOUNTER — Ambulatory Visit: Payer: Medicare Other | Admitting: Neurology

## 2023-06-03 ENCOUNTER — Encounter: Payer: Self-pay | Admitting: Neurology

## 2023-06-03 VITALS — BP 137/69 | HR 95 | Ht 64.0 in | Wt 209.0 lb

## 2023-06-03 DIAGNOSIS — Z794 Long term (current) use of insulin: Secondary | ICD-10-CM

## 2023-06-03 DIAGNOSIS — G5603 Carpal tunnel syndrome, bilateral upper limbs: Secondary | ICD-10-CM

## 2023-06-03 DIAGNOSIS — M545 Low back pain, unspecified: Secondary | ICD-10-CM

## 2023-06-03 DIAGNOSIS — E1142 Type 2 diabetes mellitus with diabetic polyneuropathy: Secondary | ICD-10-CM

## 2023-06-03 DIAGNOSIS — G629 Polyneuropathy, unspecified: Secondary | ICD-10-CM

## 2023-06-03 NOTE — Patient Instructions (Addendum)
 I saw you today for numbness and tingling in your feet. You likely have nerve damage caused by diabetes. You may have a pinched nerve in the back as well.   I want to investigate your symptoms further with the following: -Blood work today  I will be in touch when I have your results.  We also discussed EMG (nerve testing) or MRI lumbar spine to see if you also have a pinched nerve in your back, but you want to think about these. Let me know if you decide you want these.  Alpha lipoic acid 600mg  daily has some research data suggesting it helps with nerve health. No major side effects other than <1% of people report upset stomach. This can be taken twice per day (1200mg  daily) if no relief obtained. You can buy this over the counter or online.  You can also try Lidocaine cream as needed. Apply wear you have pain, tingling, or burning. Wear gloves to prevent your hands being numb. This can be bought over the counter at any drug store or online.   The physicians and staff at [facility] are committed to providing excellent care. You may receive a survey requesting feedback about your experience at our office. We strive to receive very good responses to the survey questions. If you feel that your experience would prevent you from giving the office a very good  response, please contact our office to try to remedy the situation. We may be reached at [phone]. Thank you for taking the time out of your busy day to complete the survey.  Venetia Potters, MD [facility]  Preventing Falls at [facility] are common, often dreaded events in the lives of older people. Aside from the obvious injuries and even death that may result, fall can cause wide-ranging consequences including loss of independence, mental decline, decreased activity and mobility. Younger people are also at risk of falling, especially those with chronic illnesses and fatigue.  Ways to reduce risk for falling Examine diet  and medications. Warm foods and alcohol dilate blood vessels, which can lead to dizziness when standing. Sleep aids, antidepressants and pain medications can also increase the likelihood of a fall.  Get a vision exam. Poor vision, cataracts and glaucoma increase the chances of falling.  Check foot gear. Shoes should fit snugly and have a sturdy, nonskid sole and a broad, low heel  Participate in a physician-approved exercise program to build and maintain muscle strength and improve balance and coordination. Programs that use ankle weights or stretch bands are excellent for muscle-strengthening. Water aerobics programs and low-impact Tai Chi programs have also been shown to improve balance and coordination.  Increase vitamin D intake. Vitamin D improves muscle strength and increases the amount of calcium the body is able to absorb and deposit in bones.  How to prevent falls from common hazards Floors - Remove all loose wires, cords, and throw rugs. Minimize clutter. Make sure rugs are anchored and smooth. Keep furniture in its usual place.  Chairs -- Use chairs with straight backs, armrests and firm seats. Add firm cushions to existing pieces to add height.  Bathroom - Install grab bars and non-skid tape in the tub or shower. Use a bathtub transfer bench or a shower chair with a back support Use an elevated toilet seat and/or safety rails to assist standing from a low surface. Do not use towel racks or bathroom tissue holders to help you stand.  Lighting - Make sure halls, stairways, and  entrances are well-lit. Install a night light in your bathroom or hallway. Make sure there is a light switch at the top and bottom of the staircase. Turn lights on if you get up in the middle of the night. Make sure lamps or light switches are within reach of the bed if you have to get up during the night.  Kitchen - Install non-skid rubber mats near the sink and stove. Clean spills immediately. Store frequently  used utensils, pots, pans between waist and eye level. This helps prevent reaching and bending. Sit when getting things out of lower cupboards.  Living room/ Bedrooms - Place furniture with wide spaces in between, giving enough room to move around. Establish a route through the living room that gives you something to hold onto as you walk.  Stairs - Make sure treads, rails, and rugs are secure. Install a rail on both sides of the stairs. If stairs are a threat, it might be helpful to arrange most of your activities on the lower level to reduce the number of times you must climb the stairs.  Entrances and doorways - Install metal handles on the walls adjacent to the doorknobs of all doors to make it more secure as you travel through the doorway.  Tips for maintaining balance Keep at least one hand free at all times. Try using a backpack or fanny pack to hold things rather than carrying them in your hands. Never carry objects in both hands when walking as this interferes with keeping your balance.  Attempt to swing both arms from front to back while walking. This might require a conscious effort if Parkinson's disease has diminished your movement. It will, however, help you to maintain balance and posture, and reduce fatigue.  Consciously lift your feet off of the ground when walking. Shuffling and dragging of the feet is a common culprit in losing your balance.  When trying to navigate turns, use a U technique of facing forward and making a wide turn, rather than pivoting sharply.  Try to stand with your feet shoulder-length apart. When your feet are close together for any length of time, you increase your risk of losing your balance and falling.  Do one thing at a time. Don't try to walk and accomplish another task, such as reading or looking around. The decrease in your automatic reflexes complicates motor function, so the less distraction, the better.  Do not wear rubber or gripping soled  shoes, they might catch on the floor and cause tripping.  Move slowly when changing positions. Use deliberate, concentrated movements and, if needed, use a grab bar or walking aid. Count 15 seconds between each movement. For example, when rising from a seated position, wait 15 seconds after standing to begin walking.  If balance is a continuous problem, you might want to consider a walking aid such as a cane, walking stick, or walker. Once you've mastered walking with help, you might be ready to try it on your own again.

## 2023-06-09 ENCOUNTER — Encounter: Payer: Self-pay | Admitting: Neurology

## 2023-06-09 LAB — IMMUNOFIXATION ELECTROPHORESIS
IgM, Serum: 1073 mg/dL (ref 600–300)
IgM, Serum: 67 mg/dL (ref 50–300)
Immunoglobulin A: 1073 mg/dL — ABNORMAL HIGH (ref 70–320)
Immunoglobulin A: 357 mg/dL — ABNORMAL HIGH (ref 70–320)

## 2023-06-09 LAB — VITAMIN B12: Vitamin B-12: 884 pg/mL (ref 200–1100)

## 2023-06-09 LAB — VITAMIN B1: Vitamin B1 (Thiamine): 31 nmol/L — ABNORMAL HIGH (ref 8–30)

## 2023-06-09 LAB — VITAMIN B6: Vitamin B6: 19.7 ng/mL (ref 2.1–21.7)

## 2023-06-10 ENCOUNTER — Other Ambulatory Visit: Payer: Self-pay

## 2023-06-10 DIAGNOSIS — G629 Polyneuropathy, unspecified: Secondary | ICD-10-CM

## 2023-06-10 DIAGNOSIS — M545 Low back pain, unspecified: Secondary | ICD-10-CM

## 2023-06-11 ENCOUNTER — Telehealth: Payer: Self-pay | Admitting: Neurology

## 2023-06-11 NOTE — Telephone Encounter (Signed)
Left message with on 06-10-23 and 06-11-23 to sch with Dr Loleta Chance on for a Bilat leg EMG   We will try back later

## 2023-06-15 ENCOUNTER — Telehealth: Payer: Self-pay | Admitting: Neurology

## 2023-06-15 ENCOUNTER — Encounter: Payer: Self-pay | Admitting: Neurology

## 2023-06-15 NOTE — Telephone Encounter (Signed)
I have left message on the VM on 06-10-23 and 06-11-23 and 06-15-23 to schedule appt with Dr Loleta Chance  for a bilat lower ext EMG

## 2023-07-20 ENCOUNTER — Ambulatory Visit: Payer: Medicare Other | Admitting: Neurology

## 2023-07-20 ENCOUNTER — Telehealth: Payer: Self-pay | Admitting: Neurology

## 2023-07-20 DIAGNOSIS — G629 Polyneuropathy, unspecified: Secondary | ICD-10-CM | POA: Diagnosis not present

## 2023-07-20 DIAGNOSIS — M5417 Radiculopathy, lumbosacral region: Secondary | ICD-10-CM

## 2023-07-20 NOTE — Procedures (Signed)
 [facility]  [address] [place], [address]  [place], [place] [address] Tel: [phone] Fax: [phone] Test Date:  07/20/2023  Patient: Kristy Medina DOB: May 28, 1950 Physician: Jacquelyne Balint, MD  Sex: Female Height: 5\' 4"  Ref Phys: Jacquelyne Balint, MD  ID#: [id number]   Technician:    History: THis is a 73 year old female with numbness and tingling in her feet.  NCV & EMG Findings: Extensive electrodiagnostic evaluation of the left lower limb with additional nerve conduction studies of the right lower limb shows: Bilateral sural and superficial peroneal/fibular sensory responses are within normal limits. Left peroneal/fibular (EDB) motor response shows reduced amplitude (2.2 mV). Left peroneal/fibular (TA), right peroneal/fibular (EDB), and bilateral tibial (AH) motor responses are within normal limits. Bilateral H reflexes are absent. Chronic motor axon loss changes without accompanying active denervation changes are seen in the left tibialis anterior, left flexor digitorum longus, left gluteus medius, and left lumbosacral paraspinal (L5 level) muscles. The right limb was not needled due to patient preference due to discomfort.  Impression: This is an abnormal study. 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 No electrodiagnostic evidence of a large fiber sensorimotor neuropathy. Bilateral absent H reflex is of unclear clinical significance and may be normal for age or technical in nature.    ___________________________ Jacquelyne Balint, MD    Nerve Conduction Studies Motor Nerve Results    Latency Amplitude F-Lat Segment Distance CV Comment  Site (ms) Norm (mV) Norm (ms)  (cm) (m/s) Norm   Left Fibular (EDB) Motor  Ankle 3.4  < 6.0 *2.2  > 2.5        Bel fib head 10.6 - 1.98 -  Bel fib head-Ankle 30.5 42  > 40   Pop fossa 12.5  - 1.92 -  Pop fossa-Bel fib head 9 47 -   Right Fibular (EDB) Motor  Ankle 2.9  < 6.0 2.7  > 2.5        Bel fib head 9.9 - 2.6 -  Bel fib head-Ankle 29 41  > 40   Pop fossa 11.8 - 2.5 -  Pop fossa-Bel fib head 8 42 -   Left Fibular (TA) Motor  Fib head 2.2  < 4.5 6.2  > 3.0        Pop fossa 4.0  < 6.7 5.5 -  Pop fossa-Fib head 9 50  > 40   Left Tibial (AH) Motor  Ankle 3.4  < 6.0 8.6  > 4.0        Knee 12.5 - 5.5 -  Knee-Ankle 38 42  > 40   Right Tibial (AH) Motor  Ankle 3.6  < 6.0 6.4  > 4.0        Knee 12.1 - 4.8 -  Knee-Ankle 40 47  > 40    Sensory Sites    Neg Peak Lat Amplitude (O-P) Segment Distance Velocity Comment  Site (ms) Norm (V) Norm  (cm) (ms)   Left Superficial Fibular Sensory  14 cm-Ankle 3.0  < 4.6 5  > 3 14 cm-Ankle 14    Right Superficial Fibular Sensory  14 cm-Ankle 2.6  < 4.6 5  > 3 14 cm-Ankle 14    Left Sural Sensory  Calf-Lat mall 3.5  < 4.6 4  > 3 Calf-Lat mall 14    Right Sural Sensory  Calf-Lat mall 3.6  <  4.6 4  > 3 Calf-Lat mall 14     H-Reflex Results    M-Lat H Lat H Neg Amp H-M Lat  Site (ms) (ms) Norm (mV) (ms)  Left Tibial H-Reflex  Pop fossa 5.6 -  < 35.0 - -  Right Tibial H-Reflex  Pop fossa 6.3 -  < 35.0 - -   Electromyography   Side Muscle Ins.Act Fibs Fasc Recrt Amp Dur Poly Activation Comment  Left Tib ant Nml Nml Nml *1- *1+ *1+ Nml Nml N/A  Left Gastroc MH Nml Nml Nml Nml Nml Nml Nml Nml N/A  Left FDL Nml Nml Nml *1- *1+ *1+ Nml Nml N/A  Left Rectus fem Nml Nml Nml Nml Nml Nml Nml Nml N/A  Left Biceps fem SH Nml Nml Nml Nml Nml Nml Nml Nml N/A  Left Gluteus med Nml Nml Nml *1- *1+ *1+ Nml Nml N/A  Left Lumbar PSP lower Nml Nml Nml *1- *1+ *1+ Nml Nml N/A      Waveforms:  Motor             Sensory           H-Reflex

## 2023-07-20 NOTE — Telephone Encounter (Signed)
 Discussed the results of patient's EMG after the procedure today. 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 She was in significant discomfort after needle examination of left side, so right side was not needled. There was no evidence of a large fiber sensorimotor neuropathy.  She will go to the chiropractor as this has helped her low back pain in the past. She will let me know if symptoms worsen and will follow up as planned on 12/02/23.  All questions were answered.  Jacquelyne Balint, MD [facility]

## 2023-08-14 LAB — EXTERNAL GENERIC LAB PROCEDURE: COLOGUARD: NEGATIVE

## 2023-08-14 LAB — COLOGUARD: COLOGUARD: NEGATIVE

## 2023-09-30 IMAGING — MG MM DIGITAL SCREENING BILAT W/ TOMO AND CAD
6 of 12 series · 6 of 36 positions shown · non-contrast
Comparison: Previous exam(s).

CLINICAL DATA: Screening.

EXAM:
DIGITAL SCREENING BILATERAL MAMMOGRAM WITH TOMOSYNTHESIS AND CAD
TECHNIQUE: Bilateral screening digital craniocaudal and mediolateral oblique
mammograms were obtained. Bilateral screening digital breast
tomosynthesis was performed. The images were evaluated with
computer-aided detection.

[R MLO synth-2D]
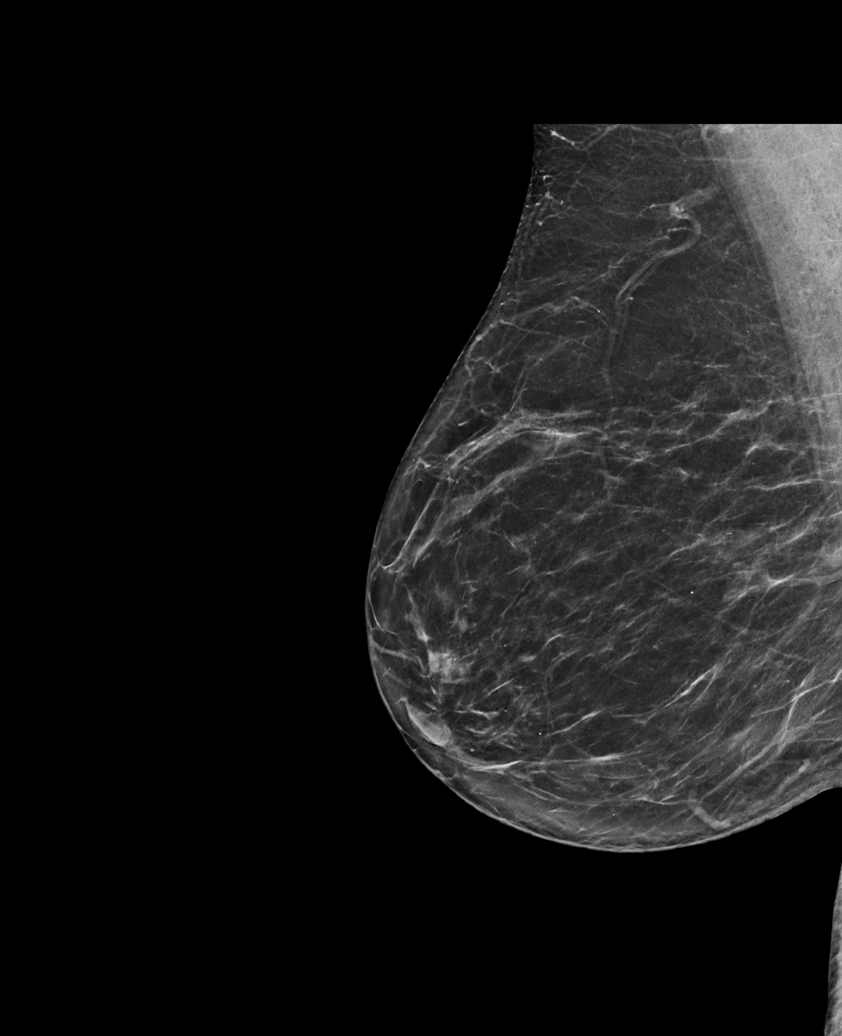

[L CC synth-2D (1 of 2)]
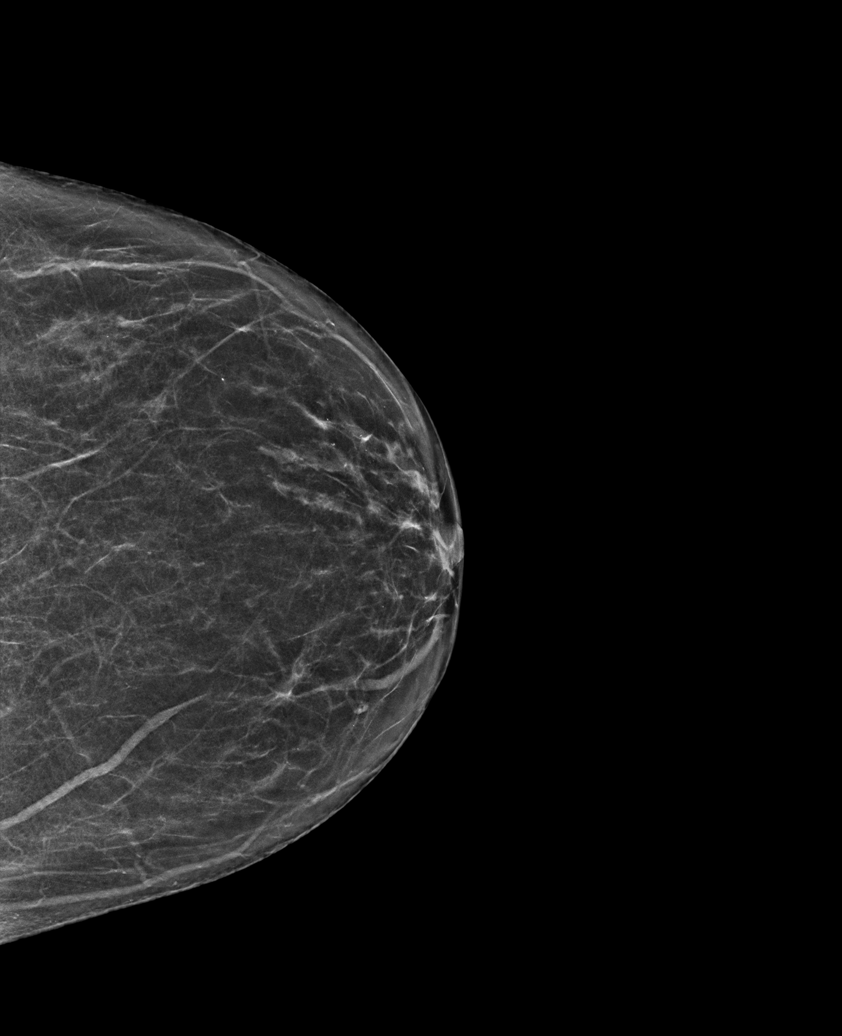

[L CC synth-2D (2 of 2)]
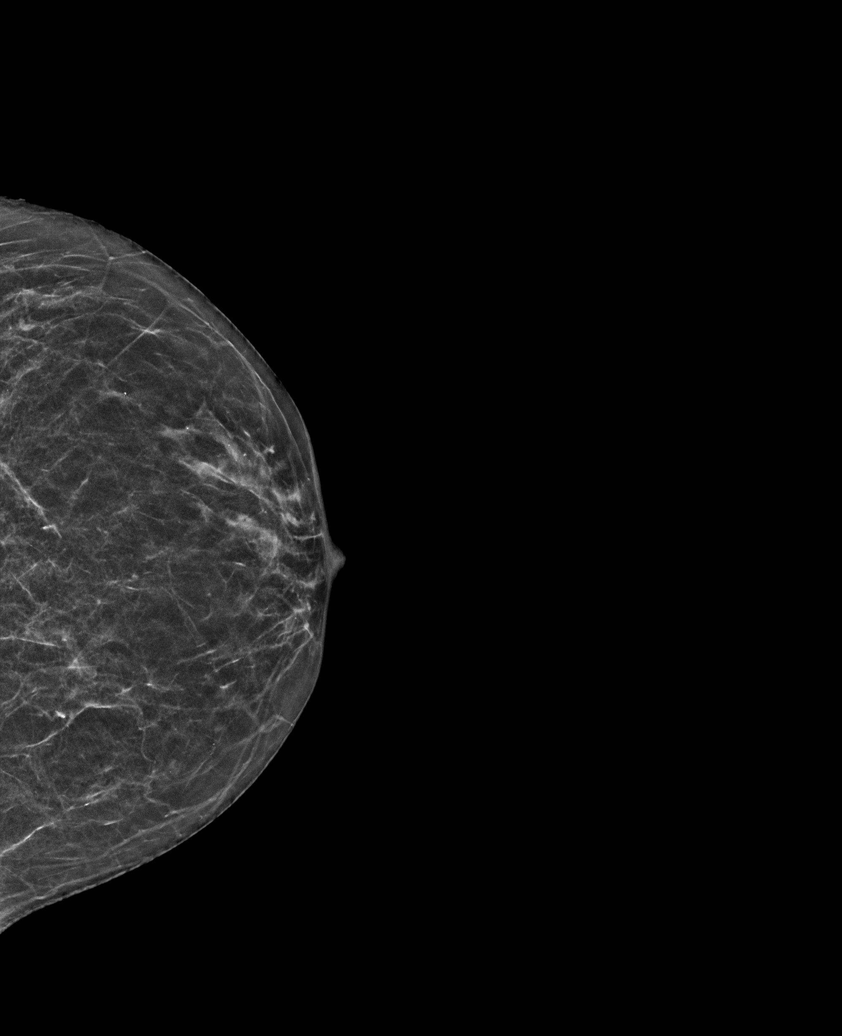

[R CC synth-2D (1 of 2)]
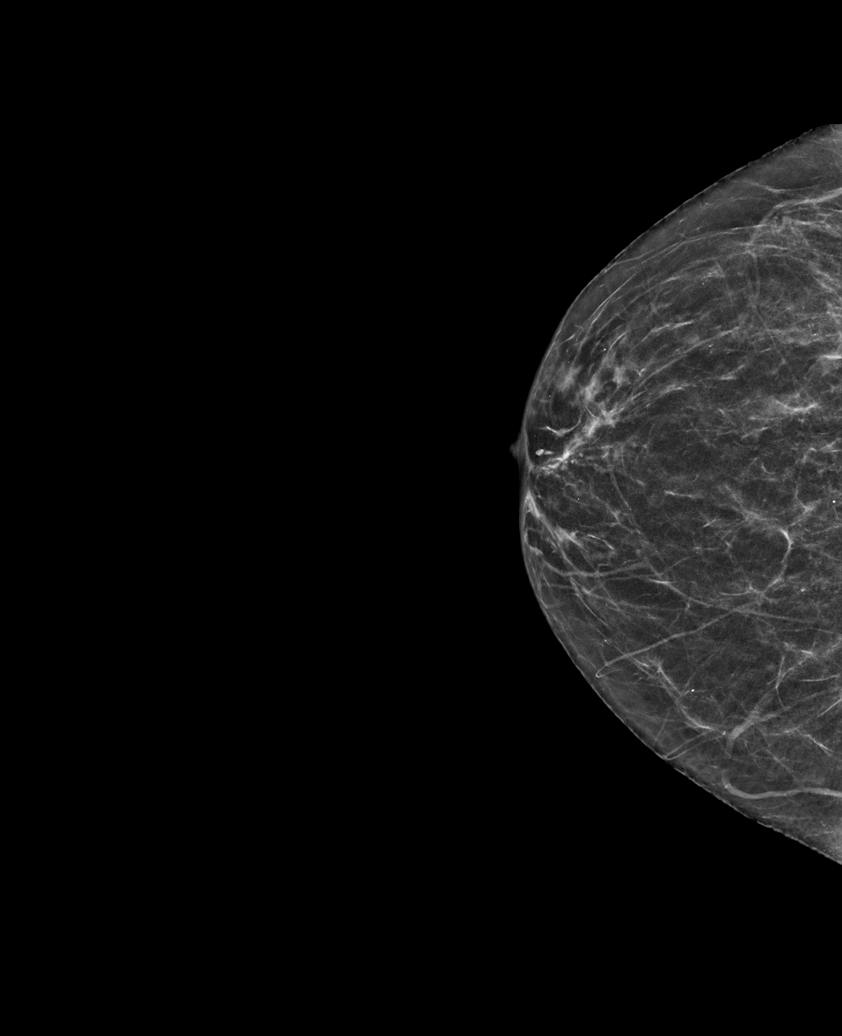

[R CC synth-2D (2 of 2)]
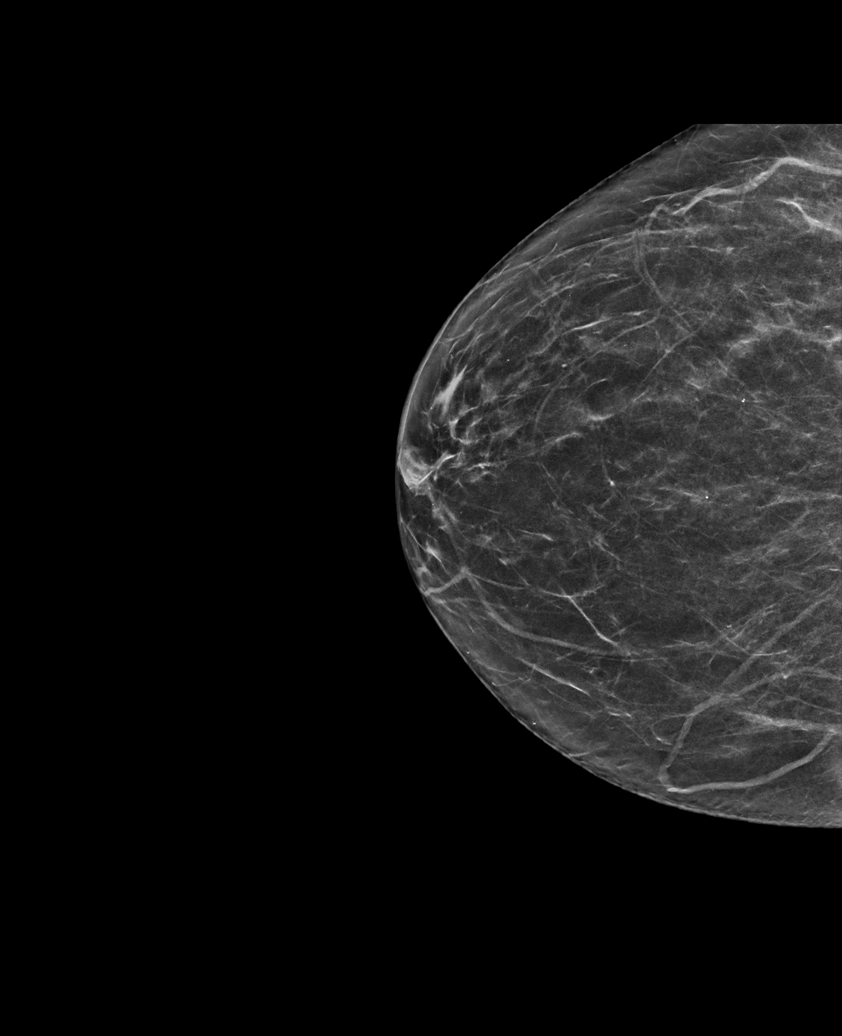

[L MLO synth-2D]
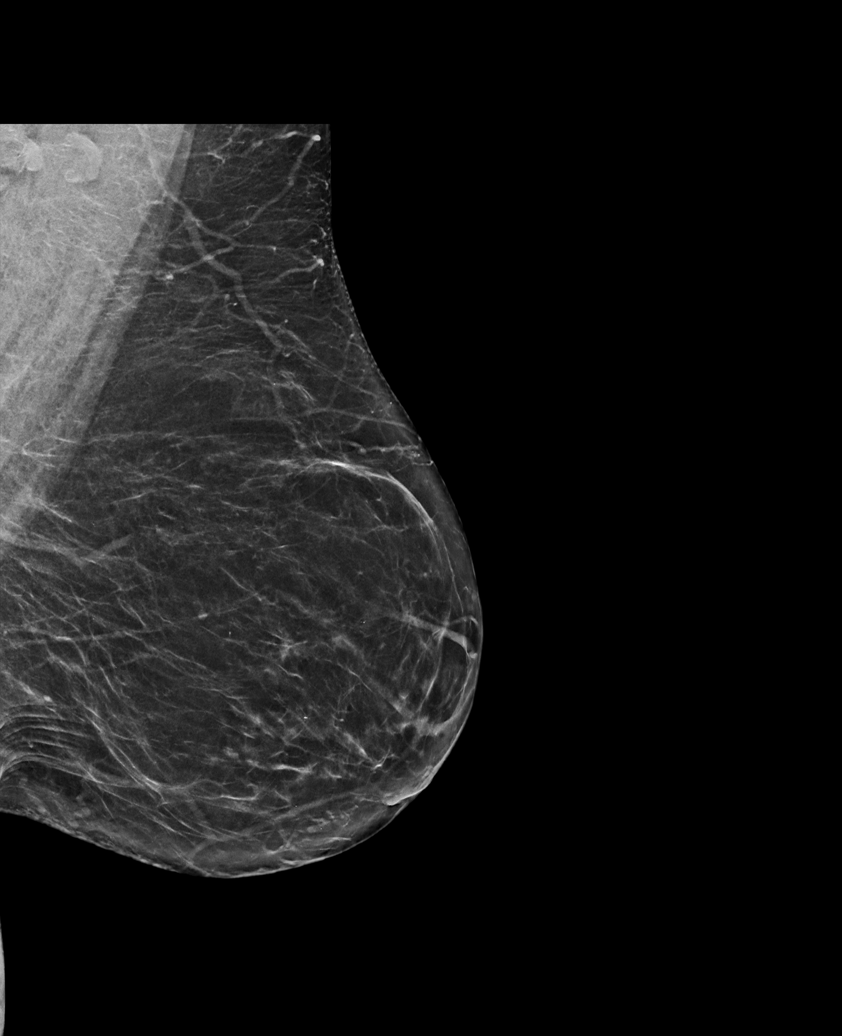

[6 of 36 positions shown; findings below may reference images not displayed]

ACR Breast Density Category b: There are scattered areas of
fibroglandular density.
FINDINGS: There are no findings suspicious for malignancy.
IMPRESSION: No mammographic evidence of malignancy. A result letter of this
screening mammogram will be mailed directly to the patient.

RECOMMENDATION:
Screening mammogram in one year. (Code:51-O-LD2)

BI-RADS CATEGORY  1: Negative.

## 2023-11-20 NOTE — Progress Notes (Signed)
 I saw Kristy Medina in neurology clinic on 12/02/23 in follow up for numbness and tingling in feet.  HPI: Kristy Medina is a 73 y.o. year old female with a history of HTN, DM, HLD, RA, carpal tunnel syndrome, right sided Bell's Palsy who we last saw on 06/03/23.  To briefly review: Patient has numbness and tingling in her feet. She has occasional shooting pain in toes. She thinks the symptoms have been present for about 3 years. She has a history of diabetes (HbA1c in 9.0 in 02/2023). She states the left side is worse than the right. She also endorses low back pain. She denies any shooting pain into her legs. She endorses imbalance but no recent falls.   She was previously on Novalog and was switch to other insulin in 03/2023. She felt since switching, maybe the numbness and tingling has improved.   Patient has a history of CTS s/p release many years ago.   Of note, patient has been diagnosed with RA but maybe also psoriatic arthritis, per patient.   Of note, patient has been on B complex, vit D, tumeric, omega 3, magnesium for years.   The patient does not report symptoms referable to autonomic dysfunction including impaired sweating, heat or cold intolerance, excessive mucosal dryness, gastroparetic early satiety, postprandial abdominal bloating, constipation, bowel or bladder dyscontrol, or syncope/presyncope/orthostatic intolerance.   She does not report any constitutional symptoms like fever, night sweats, anorexia or unintentional weight loss. She does endorse getting hot in the mornings when she wakes up and goes to the kitchen.   EtOH use: None, never  Restrictive diet? No Family history of neuropathy/myopathy/neurologic disease? Sister had back issues  Most recent Assessment and Plan (06/03/23): Kristy Medina is a 73 y.o. female who presents for evaluation of numbness and tingling in her feet. She has a relevant medical history of HTN, DM, HLD, RA, carpal  tunnel syndrome, right sided Bell's Palsy. Her neurological examination is pertinent for diminished sensation in bilateral lower extremities in a length dependent fashion, but with some asymmetry with left more affected than right and asymmetric reflexes. Available diagnostic data is significant for HbA1c of 9.0 in 02/2023. 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 I discussed EMG or MRI lumbar spine but patient wanted to think about it. I will get labs to look for other treatable causes of neuropathy.   PLAN: -Blood work: B1, B6, B12, IFE -May stop B complex pending results -Discussed EMG vs MRI lumbar spine. Patient to think about it and may get after labs result. -Lidocaine cream PRN for foot pain -Discussed alpha lipoic acid 600 mg once or twice daily -Discussed neuropathic medications, but patient is not currently interested.  Since their last visit: Labs were normal. Patient decided to do the EMG which was completed on 07/20/23 and showed an old left L5 radiculopathy with no evidence of PN. Right leg was not tested by needle exam due to patient discomfort. Patient decided she wanted to go to chiropractor for this as it had helped in the past. Patient's chiropractor retired, but she plans to establish with someone there. She feels she is out of line again.  Patient has stopped taking B6. Her legs do not bother her as much now. She still has some numbness in her feet and imbalance. She denies any falls since last visit.  She has no new complaints.   MEDICATIONS:  Outpatient  Encounter Medications as of 12/02/2023  Medication Sig   acetaminophen (TYLENOL) 325 MG tablet Take 650 mg by mouth every 6 (six) hours as needed for mild pain or headache.   aspirin 81 MG tablet Take 81 mg by mouth every other day.   b complex vitamins tablet Take 1 tablet by mouth daily.   BIOTIN PO Take 1 tablet  by mouth daily. (Patient not taking: Reported on 06/03/2023)   CHERRY PO Take 1 tablet by mouth daily.   Cholecalciferol (VITAMIN D3) 50 MCG (2000 UT) TABS Take 2,000 Units by mouth daily.   Continuous Glucose Sensor (FREESTYLE LIBRE 3 PLUS SENSOR) MISC    Evolocumab (REPATHA SURECLICK) 140 MG/ML SOAJ Inject 140 mg as directed every 14 (fourteen) days.   famotidine (PEPCID AC MAXIMUM STRENGTH) 20 MG tablet Take 20 mg by mouth daily as needed for heartburn or indigestion.   fish oil-omega-3 fatty acids 1000 MG capsule Take 1 g by mouth daily.    Garlic 1000 MG CAPS Take 1,000 mg by mouth daily.    HUMALOG KWIKPEN 100 UNIT/ML KwikPen Inject 8 Units into the skin in the morning and at bedtime.   insulin NPH-regular Human (NOVOLIN 70/30) (70-30) 100 UNIT/ML injection Inject 18-28 Units into the skin See admin instructions. Inject 28 units into the skin before breakfast and 18 units at bedtime (Patient not taking: Reported on 06/03/2023)   Menthol-Methyl Salicylate (THERA-GESIC PLUS) CREA Apply 1 application topically 2 (two) times daily as needed (to painful sites).    metFORMIN (GLUCOPHAGE) 1000 MG tablet Take 1,000 mg by mouth 2 (two) times daily.   metoprolol  tartrate (LOPRESSOR ) 100 MG tablet Take 1 tablet by mouth 2 hours prior to Cardiac CT (Patient not taking: Reported on 06/03/2023)   PRALUENT 150 MG/ML SOAJ every 28 (twenty-eight) days.  (Patient not taking: Reported on 06/03/2023)   trolamine salicylate (ASPERCREME) 10 % cream Apply 1 application topically 2 (two) times daily as needed (to painful sites).    TURMERIC PO Take 1 capsule by mouth daily.    No facility-administered encounter medications on file as of 12/02/2023.    PAST MEDICAL HISTORY: Past Medical History:  Diagnosis Date   Chest pain    Diabetes mellitus    Hypercholesterolemia    Hyperlipidemia    Hypertension     PAST SURGICAL HISTORY: Past Surgical History:  Procedure Laterality Date   ANKLE RECONSTRUCTION     CARPAL  TUNNEL RELEASE     CHOLECYSTECTOMY     mastoid surgery     MELANOMA EXCISION Right 2025    ALLERGIES: Allergies  Allergen Reactions   Omeprazole Diarrhea   Statins Other (See Comments)    Brain fog and severe memory issues   Erythromycin Nausea And Vomiting   Tramadol Nausea And Vomiting    FAMILY HISTORY: Family History  Problem Relation Age of Onset   COPD Sister    Lung cancer Brother    Kidney cancer Brother    Heart attack Brother    Breast cancer Maternal Grandmother     SOCIAL HISTORY: Social History   Tobacco Use   Smoking status: Never   Smokeless tobacco: Never  Vaping Use   Vaping status: Never Used  Substance Use Topics   Alcohol use: No   Drug use: Never   Social History   Social History Narrative   Are you right handed or left handed? Right   Are you currently employed ?    What is your current occupation? retired  Do you live at home alone?    Who lives with you? daughter   What type of home do you live in: 1 story or 2 story? one    Caffiene 2 cups daily or more at times    Objective:  Vital Signs:  BP 138/77   Pulse 86   Ht 5' 4 (1.626 m)   Wt 210 lb (95.3 kg)   SpO2 96%   BMI 36.05 kg/m   General: General appearance: Awake and alert. No distress. Cooperative with exam.  Skin: No obvious rash or jaundice. HEENT: Atraumatic. Anicteric. Lungs: Non-labored breathing on room air  Heart: Regular Extremities: No edema.  Neurological: Mental Status: Alert. Speech fluent. No pseudobulbar affect Cranial Nerves: CNII: No RAPD. Visual fields intact. CNIII, IV, VI: PERRL. No nystagmus. EOMI. CN V: Facial sensation intact bilaterally to fine touch. CN VII: Facial muscles symmetric and strong. No ptosis at rest. CN VIII: Hears finger rub well bilaterally. CN IX: No hypophonia. CN X: Palate elevates symmetrically. CN XI: Full strength shoulder shrug bilaterally. CN XII: Tongue protrusion full and midline. No atrophy or  fasciculations. No significant dysarthria Motor: Tone is normal. Strength is 5/5 in bilateral upper and lower extremities. Reflexes:  Right Left  Bicep 2+ 2+  Tricep 2+ 2+  BrRad 2+ 2+  Knee 2+ 0  Ankle 0 0   Sensation: Pinprick: Diminished in bilateral legs below the knees Vibration: Absent in bilateral great toes and ankles, intact in knees  Proprioception: Intact in bilateral great toes Coordination: Intact finger-to- nose-finger bilaterally. Gait: Able to rise from chair with arms crossed unassisted. Normal, narrow-based gait.   Lab and Test Review: New results: 06/03/23: B6 wnl B12: 884 B1 wnl IFE with no M protein  EMG (07/20/23): NCV & EMG Findings: Extensive electrodiagnostic evaluation of the left lower limb with additional nerve conduction studies of the right lower limb shows: Bilateral sural and superficial peroneal/fibular sensory responses are within normal limits. Left peroneal/fibular (EDB) motor response shows reduced amplitude (2.2 mV). Left peroneal/fibular (TA), right peroneal/fibular (EDB), and bilateral tibial (AH) motor responses are within normal limits. Bilateral H reflexes are absent. Chronic motor axon loss changes without accompanying active denervation changes are seen in the left tibialis anterior, left flexor digitorum longus, left gluteus medius, and left lumbosacral paraspinal (L5 level) muscles. The right limb was not needled due to patient preference due to discomfort.   Impression: This is an abnormal study. 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 No electrodiagnostic evidence of a large fiber sensorimotor neuropathy. Bilateral absent H reflex is of unclear clinical significance and may be normal for age or technical in nature.  Previously reviewed results: External labs: 03/11/23: HbA1c:  9.0 TSH 1.600 CBC w/ diff unremarkable CMP significant for glucose of 154 Lipid panel: Tchol 177, LDL 91, TG 216   Imaging: MRI brain w/wo contrast (09/06/19): FINDINGS: Brain:   Mild intermittent motion degradation.   There is no evidence of acute infarct.   No evidence of intracranial mass.   No midline shift or extra-axial fluid collection.   No chronic intracranial blood products.   Mild patchy T2/FLAIR hyperintensity within the cerebral white matter and pons is nonspecific, but consistent with chronic small vessel ischemic disease.   No abnormal intracranial enhancement is identified.   Cerebral volume is normal for age.   Vascular: Flow voids  maintained within the proximal large arterial vessels.   Skull and upper cervical spine: No focal marrow lesion.   Sinuses/Orbits: Visualized orbits demonstrate no acute abnormality. Mild ethmoid sinus mucosal thickening. Trace fluid within right mastoid air cells.   IMPRESSION: No evidence of acute intracranial abnormality.   Mild chronic small vessel ischemic changes within the cerebral white matter and pons.   Mild ethmoid sinus mucosal thickening. Trace right mastoid effusion.   MRI lumbar spine wo contrast (01/01/18): FINDINGS: Segmentation: Transitional lumbosacral anatomy with the transitional segment considered a partially lumbarized S1 with rudimentary S1-2 disc.   Alignment: 2 mm anterolisthesis of L5 on S1. Minimal right convex lumbar spine curvature.   Vertebrae: No fracture, suspicious osseous lesion, or significant marrow edema. Small L2 superior endplate Schmorl's node. Mild type 2 degenerative endplate changes on the left at L5-S1.   Conus medullaris and cauda equina: Conus extends to the L1 level. Conus and cauda equina appear normal.   Paraspinal and other soft tissues: 3 cm T2 hypointense mass in the uterine fundus, possibly an intramural fibroid.   Disc levels:   T12-L1: Negative.    L1-2: Mild disc desiccation.  Mild spondylosis.  No stenosis.   L2-3: Normal disc.  Mild facet hypertrophy without stenosis.   L3-4: Disc desiccation. Minimal disc bulging and mild facet hypertrophy without stenosis.   L4-5: Disc desiccation. Mild disc bulging and mild facet and ligamentum flavum hypertrophy without stenosis.   L5-S1: Anterolisthesis with disc uncovering, mild ligamentum flavum hypertrophy, and severe facet arthrosis result in mild right lateral recess stenosis without significant spinal or neural foraminal stenosis.   S1-2: Transitional level with rudimentary disc. No stenosis.   IMPRESSION: 1. Severe L5-S1 facet arthrosis with grade 1 anterolisthesis and mild right lateral recess stenosis. 2. Mild spondylosis and facet arthrosis elsewhere without stenosis. 3. 3 cm uterine mass, most likely a fibroid.  ASSESSMENT: This is Kristy Medina, a 73 y.o. female with numbness in bilateral feet. Her exam with asymmetric reflexes and EMG did not show any evidence of a large fiber neuropathy, but left L5-S1 radiculopathy. 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  Plan: -Continue to avoid B complex -Discussed PT, patient would rather do chiropractor -Continue alpha lipoic acid and magnesium supplementation -Fall precautions discussed  Return to clinic in 1 year  Total time spent reviewing records, interview, history/exam, documentation, and coordination of care on day of encounter:  30 min  Venetia Potters, MD

## 2023-12-02 ENCOUNTER — Ambulatory Visit: Payer: Medicare Other | Admitting: Neurology

## 2023-12-02 ENCOUNTER — Encounter: Payer: Self-pay | Admitting: Neurology

## 2023-12-02 VITALS — BP 138/77 | HR 86 | Ht 64.0 in | Wt 210.0 lb

## 2023-12-02 DIAGNOSIS — E1142 Type 2 diabetes mellitus with diabetic polyneuropathy: Secondary | ICD-10-CM | POA: Diagnosis not present

## 2023-12-02 DIAGNOSIS — M545 Low back pain, unspecified: Secondary | ICD-10-CM

## 2023-12-02 DIAGNOSIS — Z794 Long term (current) use of insulin: Secondary | ICD-10-CM

## 2023-12-02 DIAGNOSIS — M5417 Radiculopathy, lumbosacral region: Secondary | ICD-10-CM | POA: Diagnosis not present

## 2023-12-02 NOTE — Patient Instructions (Signed)
 Continue to avoid taking B complex or anything with extra B6 in it.  Continue alpha lipoic acid and magnesium supplementation.  We discussed physical therapy or chiropractor for your back. You would like to continue going to the chiropractor. Let me know if you change your mind.  I will see you back in about 1 year. Please let me know if you have any questions or concerns in the meantime.  The physicians and staff at [facility] are committed to providing excellent care. You may receive a survey requesting feedback about your experience at our office. We strive to receive very good responses to the survey questions. If you feel that your experience would prevent you from giving the office a very good  response, please contact our office to try to remedy the situation. We may be reached at [phone]. Thank you for taking the time out of your busy day to complete the survey.  Venetia Potters, MD [facility]  Preventing Falls at [facility] are common, often dreaded events in the lives of older people. Aside from the obvious injuries and even death that may result, fall can cause wide-ranging consequences including loss of independence, mental decline, decreased activity and mobility. Younger people are also at risk of falling, especially those with chronic illnesses and fatigue.  Ways to reduce risk for falling Examine diet and medications. Warm foods and alcohol dilate blood vessels, which can lead to dizziness when standing. Sleep aids, antidepressants and pain medications can also increase the likelihood of a fall.  Get a vision exam. Poor vision, cataracts and glaucoma increase the chances of falling.  Check foot gear. Shoes should fit snugly and have a sturdy, nonskid sole and a broad, low heel  Participate in a physician-approved exercise program to build and maintain muscle strength and improve balance and coordination. Programs that use ankle weights or stretch bands are  excellent for muscle-strengthening. Water aerobics programs and low-impact Tai Chi programs have also been shown to improve balance and coordination.  Increase vitamin D intake. Vitamin D improves muscle strength and increases the amount of calcium the body is able to absorb and deposit in bones.  How to prevent falls from common hazards Floors - Remove all loose wires, cords, and throw rugs. Minimize clutter. Make sure rugs are anchored and smooth. Keep furniture in its usual place.  Chairs -- Use chairs with straight backs, armrests and firm seats. Add firm cushions to existing pieces to add height.  Bathroom - Install grab bars and non-skid tape in the tub or shower. Use a bathtub transfer bench or a shower chair with a back support Use an elevated toilet seat and/or safety rails to assist standing from a low surface. Do not use towel racks or bathroom tissue holders to help you stand.  Lighting - Make sure halls, stairways, and entrances are well-lit. Install a night light in your bathroom or hallway. Make sure there is a light switch at the top and bottom of the staircase. Turn lights on if you get up in the middle of the night. Make sure lamps or light switches are within reach of the bed if you have to get up during the night.  Kitchen - Install non-skid rubber mats near the sink and stove. Clean spills immediately. Store frequently used utensils, pots, pans between waist and eye level. This helps prevent reaching and bending. Sit when getting things out of lower cupboards.  Living room/ Bedrooms - Place furniture with wide spaces in  between, giving enough room to move around. Establish a route through the living room that gives you something to hold onto as you walk.  Stairs - Make sure treads, rails, and rugs are secure. Install a rail on both sides of the stairs. If stairs are a threat, it might be helpful to arrange most of your activities on the lower level to reduce the number of times  you must climb the stairs.  Entrances and doorways - Install metal handles on the walls adjacent to the doorknobs of all doors to make it more secure as you travel through the doorway.  Tips for maintaining balance Keep at least one hand free at all times. Try using a backpack or fanny pack to hold things rather than carrying them in your hands. Never carry objects in both hands when walking as this interferes with keeping your balance.  Attempt to swing both arms from front to back while walking. This might require a conscious effort if Parkinson's disease has diminished your movement. It will, however, help you to maintain balance and posture, and reduce fatigue.  Consciously lift your feet off of the ground when walking. Shuffling and dragging of the feet is a common culprit in losing your balance.  When trying to navigate turns, use a U technique of facing forward and making a wide turn, rather than pivoting sharply.  Try to stand with your feet shoulder-length apart. When your feet are close together for any length of time, you increase your risk of losing your balance and falling.  Do one thing at a time. Don't try to walk and accomplish another task, such as reading or looking around. The decrease in your automatic reflexes complicates motor function, so the less distraction, the better.  Do not wear rubber or gripping soled shoes, they might catch on the floor and cause tripping.  Move slowly when changing positions. Use deliberate, concentrated movements and, if needed, use a grab bar or walking aid. Count 15 seconds between each movement. For example, when rising from a seated position, wait 15 seconds after standing to begin walking.  If balance is a continuous problem, you might want to consider a walking aid such as a cane, walking stick, or walker. Once you've mastered walking with help, you might be ready to try it on your own again.

## 2024-05-09 ENCOUNTER — Other Ambulatory Visit (HOSPITAL_COMMUNITY): Payer: Self-pay

## 2024-05-09 MED ORDER — MOUNJARO 2.5 MG/0.5ML ~~LOC~~ SOAJ
2.5000 mg | SUBCUTANEOUS | 1 refills | Status: DC
  Filled 2024-05-09: qty 2, 28d supply, fill #0
  Filled 2024-05-12 – 2024-06-01 (×2): qty 2, 28d supply, fill #1

## 2024-05-12 ENCOUNTER — Other Ambulatory Visit (HOSPITAL_COMMUNITY): Payer: Self-pay

## 2024-06-01 ENCOUNTER — Other Ambulatory Visit: Payer: Self-pay

## 2024-06-01 ENCOUNTER — Other Ambulatory Visit (HOSPITAL_COMMUNITY): Payer: Self-pay

## 2024-06-06 ENCOUNTER — Other Ambulatory Visit (HOSPITAL_COMMUNITY): Payer: Self-pay

## 2024-06-13 ENCOUNTER — Other Ambulatory Visit (HOSPITAL_COMMUNITY): Payer: Self-pay

## 2024-06-13 MED ORDER — REPATHA SURECLICK 140 MG/ML ~~LOC~~ SOAJ
140.0000 mg | SUBCUTANEOUS | 3 refills | Status: AC
  Filled 2024-06-13: qty 2, 28d supply, fill #0

## 2024-06-13 MED ORDER — MOUNJARO 2.5 MG/0.5ML ~~LOC~~ SOAJ
2.5000 mg | SUBCUTANEOUS | 3 refills | Status: AC
  Filled 2024-06-24: qty 6, 84d supply, fill #0
  Filled 2024-07-01: qty 2, 28d supply, fill #0

## 2024-06-13 MED ORDER — FREESTYLE LIBRE 3 PLUS SENSOR MISC
3 refills | Status: AC
  Filled 2024-06-13: qty 6, 90d supply, fill #0

## 2024-06-13 MED ORDER — METFORMIN HCL 1000 MG PO TABS
1000.0000 mg | ORAL_TABLET | Freq: Two times a day (BID) | ORAL | 3 refills | Status: AC
  Filled 2024-06-13: qty 180, 90d supply, fill #0

## 2024-06-13 MED ORDER — INSULIN PEN NEEDLE 31G X 8 MM MISC
3 refills | Status: AC
  Filled 2024-06-13: qty 400, 90d supply, fill #0
  Filled 2024-07-01: qty 400, 100d supply, fill #0

## 2024-06-13 MED ORDER — LANTUS SOLOSTAR 100 UNIT/ML ~~LOC~~ SOPN
20.0000 [IU] | PEN_INJECTOR | Freq: Every evening | SUBCUTANEOUS | 3 refills | Status: AC
  Filled 2024-06-13: qty 18, 90d supply, fill #0

## 2024-06-14 ENCOUNTER — Other Ambulatory Visit (HOSPITAL_COMMUNITY): Payer: Self-pay

## 2024-06-15 ENCOUNTER — Other Ambulatory Visit (HOSPITAL_BASED_OUTPATIENT_CLINIC_OR_DEPARTMENT_OTHER): Payer: Self-pay

## 2024-06-15 ENCOUNTER — Other Ambulatory Visit (HOSPITAL_COMMUNITY): Payer: Self-pay

## 2024-06-24 ENCOUNTER — Other Ambulatory Visit (HOSPITAL_COMMUNITY): Payer: Self-pay

## 2024-07-01 ENCOUNTER — Other Ambulatory Visit (HOSPITAL_COMMUNITY): Payer: Self-pay

## 2024-12-01 ENCOUNTER — Ambulatory Visit: Admitting: Neurology
